# Patient Record
Sex: Female | Born: 1955 | Race: White | Hispanic: No | Marital: Married | Smoking: Former smoker
Health system: Southern US, Community
[De-identification: ages and names within clinical notes are randomized; demographics above are authoritative.]

## PROBLEM LIST (undated history)

## (undated) DIAGNOSIS — G8929 Other chronic pain: Secondary | ICD-10-CM

## (undated) DIAGNOSIS — F419 Anxiety disorder, unspecified: Secondary | ICD-10-CM

## (undated) DIAGNOSIS — M545 Low back pain, unspecified: Secondary | ICD-10-CM

## (undated) DIAGNOSIS — L719 Rosacea, unspecified: Secondary | ICD-10-CM

## (undated) DIAGNOSIS — E119 Type 2 diabetes mellitus without complications: Secondary | ICD-10-CM

## (undated) DIAGNOSIS — J302 Other seasonal allergic rhinitis: Secondary | ICD-10-CM

## (undated) DIAGNOSIS — E039 Hypothyroidism, unspecified: Secondary | ICD-10-CM

## (undated) DIAGNOSIS — J45909 Unspecified asthma, uncomplicated: Secondary | ICD-10-CM

## (undated) DIAGNOSIS — E785 Hyperlipidemia, unspecified: Secondary | ICD-10-CM

## (undated) HISTORY — PX: LAPAROTOMY: SHX154

## (undated) HISTORY — PX: OTHER SURGICAL HISTORY: SHX169

---

## 1998-11-03 ENCOUNTER — Other Ambulatory Visit: Admission: RE | Admit: 1998-11-03 | Discharge: 1998-11-03 | Payer: Self-pay | Admitting: Obstetrics and Gynecology

## 1998-12-26 ENCOUNTER — Ambulatory Visit (HOSPITAL_COMMUNITY): Admission: RE | Admit: 1998-12-26 | Discharge: 1998-12-26 | Payer: Self-pay | Admitting: Gastroenterology

## 1999-09-29 ENCOUNTER — Other Ambulatory Visit: Admission: RE | Admit: 1999-09-29 | Discharge: 1999-09-29 | Payer: Self-pay | Admitting: Obstetrics and Gynecology

## 2001-01-16 ENCOUNTER — Other Ambulatory Visit: Admission: RE | Admit: 2001-01-16 | Discharge: 2001-01-16 | Payer: Self-pay | Admitting: Obstetrics and Gynecology

## 2001-10-28 ENCOUNTER — Emergency Department (HOSPITAL_COMMUNITY): Admission: EM | Admit: 2001-10-28 | Discharge: 2001-10-28 | Payer: Self-pay

## 2002-01-29 ENCOUNTER — Other Ambulatory Visit: Admission: RE | Admit: 2002-01-29 | Discharge: 2002-01-29 | Payer: Self-pay | Admitting: Obstetrics and Gynecology

## 2003-05-28 ENCOUNTER — Other Ambulatory Visit: Admission: RE | Admit: 2003-05-28 | Discharge: 2003-05-28 | Payer: Self-pay | Admitting: Obstetrics and Gynecology

## 2004-05-25 ENCOUNTER — Encounter (INDEPENDENT_AMBULATORY_CARE_PROVIDER_SITE_OTHER): Payer: Self-pay | Admitting: Specialist

## 2004-05-25 ENCOUNTER — Ambulatory Visit (HOSPITAL_COMMUNITY): Admission: RE | Admit: 2004-05-25 | Discharge: 2004-05-25 | Payer: Self-pay | Admitting: Gastroenterology

## 2009-06-11 ENCOUNTER — Emergency Department (HOSPITAL_COMMUNITY): Admission: EM | Admit: 2009-06-11 | Discharge: 2009-06-11 | Payer: Self-pay | Admitting: Family Medicine

## 2009-06-16 ENCOUNTER — Emergency Department (HOSPITAL_COMMUNITY): Admission: EM | Admit: 2009-06-16 | Discharge: 2009-06-16 | Payer: Self-pay | Admitting: Emergency Medicine

## 2010-12-20 HISTORY — PX: COLONOSCOPY: SHX174

## 2011-03-29 LAB — POCT I-STAT, CHEM 8
BUN: 20 mg/dL (ref 6–23)
Calcium, Ion: 1.13 mmol/L (ref 1.12–1.32)
Chloride: 101 mEq/L (ref 96–112)
Creatinine, Ser: 0.8 mg/dL (ref 0.4–1.2)
Glucose, Bld: 89 mg/dL (ref 70–99)
HCT: 45 % (ref 36.0–46.0)
Hemoglobin: 15.3 g/dL — ABNORMAL HIGH (ref 12.0–15.0)
Potassium: 3.7 mEq/L (ref 3.5–5.1)
Sodium: 137 mEq/L (ref 135–145)
TCO2: 25 mmol/L (ref 0–100)

## 2011-05-07 NOTE — Op Note (Signed)
NAME:  Patricia Howell, Patricia Howell                     ACCOUNT NO.:  [id number]   MEDICAL RECORD NO.:  [id number]                   PATIENT TYPE:  AMB   LOCATION:  ENDO                                 FACILITY:  [facility]   PHYSICIAN:  John C. Madilyn Fireman, M.D.                 DATE OF BIRTH:  10/26/1956   DATE OF PROCEDURE:  05/25/2004  DATE OF DISCHARGE:                                 OPERATIVE REPORT   PROCEDURE:  Colonoscopy with polypectomy.   INDICATION FOR PROCEDURE:  Family history of colon cancer with last  colonoscopy 5 years ago.   DESCRIPTION OF PROCEDURE:  The patient was placed in the left lateral  decubitus position and placed on the pulse monitor with continuous low-flow  oxygen delivered by nasal cannula.  She was sedated with 100 mcg IV fentanyl  and 9 mg IV Versed.  The Olympus video colonoscope was inserted into the  rectum and advanced to the cecum, confirmed by transillumination at  McBurney's point and visualization of the ileocecal valve and appendiceal  orifice.  The prep was excellent.  The cecum, ascending, and transverse  colon appeared normal with no masses, polyps, diverticula, or other mucosal  abnormalities.  Within the descending colon, there was a 6 mm polyp that was  removed by hot biopsy.  The remainder of the descending, sigmoid, and rectum  appeared normal with no further polyps, masses, diverticula, or other  mucosal abnormalities.  The scope was then withdrawn and the patient  returned to the recovery room in stable condition.  She tolerated the  procedure well, and there were no immediate complications.   IMPRESSION:  1. Small descending colon polyp.  2. Otherwise, normal study.   PLAN:  We will await histology and probably repeat colonoscopy in 5 years.                                               John C. Madilyn Fireman, M.D.    JCH/MEDQ  D:  05/25/2004  T:  05/25/2004  Job:  [id number]   cc:   Sherry A. Rosalio Macadamia, M.D.  [address]  [place]  [place] [address]  Fax: [phone]   Oley Balm. Georgina Pillion, M.D.  [address]  [place]  [place] [address]  Fax: [phone]

## 2013-05-09 ENCOUNTER — Encounter (HOSPITAL_COMMUNITY): Payer: Self-pay

## 2013-05-09 ENCOUNTER — Encounter (HOSPITAL_COMMUNITY)
Admission: RE | Admit: 2013-05-09 | Discharge: 2013-05-09 | Disposition: A | Discharge status: Home / Self Care | Payer: BC Managed Care – PPO | Source: Ambulatory Visit | Attending: Obstetrics and Gynecology | Admitting: Obstetrics and Gynecology

## 2013-05-09 DIAGNOSIS — Z01818 Encounter for other preprocedural examination: Secondary | ICD-10-CM | POA: Insufficient documentation

## 2013-05-09 DIAGNOSIS — Z01812 Encounter for preprocedural laboratory examination: Secondary | ICD-10-CM | POA: Insufficient documentation

## 2013-05-09 HISTORY — DX: Other seasonal allergic rhinitis: J30.2

## 2013-05-09 HISTORY — DX: Low back pain, unspecified: M54.50

## 2013-05-09 HISTORY — DX: Low back pain: M54.5

## 2013-05-09 HISTORY — DX: Rosacea, unspecified: L71.9

## 2013-05-09 HISTORY — DX: Anxiety disorder, unspecified: F41.9

## 2013-05-09 HISTORY — DX: Hypothyroidism, unspecified: E03.9

## 2013-05-09 HISTORY — DX: Type 2 diabetes mellitus without complications: E11.9

## 2013-05-09 HISTORY — DX: Other chronic pain: G89.29

## 2013-05-09 HISTORY — DX: Hyperlipidemia, unspecified: E78.5

## 2013-05-09 HISTORY — DX: Unspecified asthma, uncomplicated: J45.909

## 2013-05-09 LAB — BASIC METABOLIC PANEL
CO2: 30 mEq/L (ref 19–32)
Chloride: 99 mEq/L (ref 96–112)
Creatinine, Ser: 0.91 mg/dL (ref 0.50–1.10)
Potassium: 5 mEq/L (ref 3.5–5.1)

## 2013-05-09 LAB — CBC
HCT: 41.5 % (ref 36.0–46.0)
Hemoglobin: 14.7 g/dL (ref 12.0–15.0)
MCV: 94.1 fL (ref 78.0–100.0)
RBC: 4.41 MIL/uL (ref 3.87–5.11)
RDW: 12.1 % (ref 11.5–15.5)
WBC: 4.8 10*3/uL (ref 4.0–10.5)

## 2013-05-09 NOTE — Patient Instructions (Addendum)
   Your procedure is scheduled on: Thursday, June 5  Enter through the [employer] of [facility] at: 630 am Pick up the phone at the desk and dial [phone] and inform us of your arrival.  Please call this number if you have any problems the morning of surgery: [phone]  Remember: Do not eat or drink after midnight: Wednesday Take these medicines the morning of surgery with a SIP OF WATER: Synthroid, claritan if needed with small sip of water on day of surgery.  Do not wear jewelry, make-up, or FINGER nail polish No metal in your hair or on your body. Do not wear lotions, powders, perfumes. You may wear deodorant.  Please use your CHG wash as directed prior to surgery.  Do not shave anywhere for at least 12 hours prior to first CHG shower.  Do not bring valuables to the hospital. Contacts, dentures or bridgework may not be worn into surgery.  Patients discharged on the day of surgery will not be allowed to drive home.  Home with friend Manon Hilding.

## 2013-05-10 MED ORDER — BUPIVACAINE HCL (PF) 0.25 % IJ SOLN
INTRAMUSCULAR | Status: AC
  Filled 2013-05-10: qty 30

## 2013-05-17 ENCOUNTER — Other Ambulatory Visit: Payer: Self-pay | Admitting: Obstetrics and Gynecology

## 2013-05-23 NOTE — H&P (Signed)
NAME:  Patricia Howell, ARNALL NO.:  [id number]  MEDICAL RECORD NO.:  [id number]  LOCATION:                                 FACILITY:  PHYSICIAN:  Lenoard Aden, M.D.DATE OF BIRTH:  1956-02-22  DATE OF ADMISSION: DATE OF DISCHARGE:                             HISTORY & PHYSICAL   Admitted to hospital outpatient surgery May 24, 2013  CHIEF COMPLAINT:  Postmenopausal bleeding, questionable structural lesions.  HISTORY OF PRESENT ILLNESS:  She is a 57 year old female G2, P2 with dysfunctional bleeding and questionable structural mass on sonohysterogram for definitive therapy.  ALLERGIES:  Her allergies are to amoxicillin and penicillin.  MEDICATIONS:  Include Claritin p.r.n., calcium, Effexor, Synthroid, Crestor, multivitamin, and fish oil.  SOCIAL HISTORY:  She is a nonsmoker, nondrinker.  Denies domestic physical violence.  PAST MEDICAL HISTORY:  She has a history of myomectomy in 1989, an excision of lymph node in 1989, history of 2 previous vaginal deliveries.  PHYSICAL EXAMINATION:  GENERAL:  She is a well-developed, well-nourished female, in no acute distress.  Height of 61 inches, weight of 201 pounds. HEENT:  Normal. NECK:  Supple.  Full range of motion. LUNGS:  Clear. HEART:  Regular rhythm. ABDOMEN:  Soft, nontender. PELVIC:  Uterus to be bulky, anteflexed.  No adnexal masses. EXTREMITIES:  No cords. NEUROLOGIC:  Nonfocal. SKIN:  Intact.  IMPRESSION:  Postmenopausal bleeding.  Questionable structural lesion.  PLAN:  Proceed with diagnostic hysteroscopy, Versapoint resection, D and C.  Risks of anesthesia, infection, bleeding, injury to surrounding organs, possible need for repair was discussed, delayed versus immediate complications to include bowel and bladder injury noted.  The patient acknowledges and wishes to proceed.     Lenoard Aden, M.D.     RJT/MEDQ  D:  05/23/2013  T:  05/23/2013  Job:  [id number]

## 2013-05-24 ENCOUNTER — Encounter (HOSPITAL_COMMUNITY)
Admission: RE | Disposition: A | Discharge status: Home / Self Care | Payer: Self-pay | Source: Ambulatory Visit | Attending: Obstetrics and Gynecology

## 2013-05-24 ENCOUNTER — Encounter (HOSPITAL_COMMUNITY): Payer: Self-pay | Admitting: Anesthesiology

## 2013-05-24 ENCOUNTER — Ambulatory Visit (HOSPITAL_COMMUNITY)
Admission: RE | Admit: 2013-05-24 | Discharge: 2013-05-24 | Disposition: A | Discharge status: Home / Self Care | Payer: BC Managed Care – PPO | Source: Ambulatory Visit | Attending: Obstetrics and Gynecology | Admitting: Obstetrics and Gynecology

## 2013-05-24 ENCOUNTER — Ambulatory Visit (HOSPITAL_COMMUNITY): Payer: BC Managed Care – PPO | Admitting: Anesthesiology

## 2013-05-24 DIAGNOSIS — N950 Postmenopausal bleeding: Secondary | ICD-10-CM | POA: Insufficient documentation

## 2013-05-24 DIAGNOSIS — N840 Polyp of corpus uteri: Secondary | ICD-10-CM | POA: Insufficient documentation

## 2013-05-24 HISTORY — PX: DILITATION & CURRETTAGE/HYSTROSCOPY WITH VERSAPOINT RESECTION: SHX5571

## 2013-05-24 SURGERY — DILATATION & CURETTAGE/HYSTEROSCOPY WITH VERSAPOINT RESECTION
Anesthesia: General | Site: Uterus | Wound class: Clean Contaminated

## 2013-05-24 MED ORDER — FENTANYL CITRATE 0.05 MG/ML IJ SOLN
INTRAMUSCULAR | Status: AC
  Filled 2013-05-24: qty 2

## 2013-05-24 MED ORDER — KETOROLAC TROMETHAMINE 30 MG/ML IJ SOLN
INTRAMUSCULAR | Status: AC
  Administered 2013-05-24: 30 mg
  Filled 2013-05-24: qty 1

## 2013-05-24 MED ORDER — KETOROLAC TROMETHAMINE 30 MG/ML IJ SOLN: 15.0000 mg | Freq: Once | INTRAMUSCULAR | Status: DC | PRN

## 2013-05-24 MED ORDER — CEFAZOLIN SODIUM-DEXTROSE 2-3 GM-% IV SOLR
2.0000 g | INTRAVENOUS | Status: AC
  Administered 2013-05-24: 2 g via INTRAVENOUS

## 2013-05-24 MED ORDER — LIDOCAINE HCL (CARDIAC) 20 MG/ML IV SOLN
INTRAVENOUS | Status: DC | PRN
  Administered 2013-05-24: 100 mg via INTRAVENOUS

## 2013-05-24 MED ORDER — GLYCOPYRROLATE 0.2 MG/ML IJ SOLN
INTRAMUSCULAR | Status: DC | PRN
  Administered 2013-05-24: 0.2 mg via INTRAVENOUS

## 2013-05-24 MED ORDER — LACTATED RINGERS IV SOLN
INTRAVENOUS | Status: DC
  Administered 2013-05-24 (×2): via INTRAVENOUS

## 2013-05-24 MED ORDER — FENTANYL CITRATE 0.05 MG/ML IJ SOLN
25.0000 ug | INTRAMUSCULAR | Status: DC | PRN
  Administered 2013-05-24 (×3): 50 ug via INTRAVENOUS

## 2013-05-24 MED ORDER — MIDAZOLAM HCL 5 MG/5ML IJ SOLN
INTRAMUSCULAR | Status: DC | PRN
  Administered 2013-05-24: 2 mg via INTRAVENOUS

## 2013-05-24 MED ORDER — VASOPRESSIN 20 UNIT/ML IJ SOLN
INTRAVENOUS | Status: DC | PRN
  Administered 2013-05-24: 10:00:00 via INTRAMUSCULAR

## 2013-05-24 MED ORDER — PROPOFOL 10 MG/ML IV BOLUS
INTRAVENOUS | Status: DC | PRN
  Administered 2013-05-24: 200 mg via INTRAVENOUS

## 2013-05-24 MED ORDER — FENTANYL CITRATE 0.05 MG/ML IJ SOLN
INTRAMUSCULAR | Status: DC | PRN
  Administered 2013-05-24 (×2): 50 ug via INTRAVENOUS

## 2013-05-24 MED ORDER — OXYCODONE-ACETAMINOPHEN 5-325 MG PO TABS: 1.0000 | ORAL_TABLET | ORAL | Status: AC | PRN

## 2013-05-24 MED ORDER — VASOPRESSIN 20 UNIT/ML IJ SOLN
INTRAMUSCULAR | Status: AC
  Filled 2013-05-24: qty 1

## 2013-05-24 MED ORDER — PHENYLEPHRINE HCL 10 MG/ML IJ SOLN
INTRAMUSCULAR | Status: DC | PRN
  Administered 2013-05-24: 80 ug via INTRAVENOUS

## 2013-05-24 MED ORDER — MIDAZOLAM HCL 2 MG/2ML IJ SOLN
INTRAMUSCULAR | Status: AC
  Filled 2013-05-24: qty 2

## 2013-05-24 MED ORDER — CEFAZOLIN SODIUM-DEXTROSE 2-3 GM-% IV SOLR
INTRAVENOUS | Status: AC
  Filled 2013-05-24: qty 50

## 2013-05-24 MED ORDER — SODIUM CHLORIDE 0.9 % IR SOLN
Status: DC | PRN
  Administered 2013-05-24: 3000 mL

## 2013-05-24 SURGICAL SUPPLY — 17 items
CANISTER SUCTION 2500CC (MISCELLANEOUS) ×6 IMPLANT
CATH ROBINSON RED A/P 16FR (CATHETERS) ×2 IMPLANT
CLOTH BEACON ORANGE TIMEOUT ST (SAFETY) ×2 IMPLANT
CONTAINER PREFILL 10% NBF 60ML (FORM) ×4 IMPLANT
DRESSING TELFA 8X3 (GAUZE/BANDAGES/DRESSINGS) ×2 IMPLANT
ELECTRODE RT ANGLE VERSAPOINT (CUTTING LOOP) ×2 IMPLANT
GLOVE BIO SURGEON STRL SZ7.5 (GLOVE) ×4 IMPLANT
GLOVE BIOGEL PI IND STRL 7.0 (GLOVE) ×2 IMPLANT
GLOVE BIOGEL PI INDICATOR 7.0 (GLOVE) ×2
GLOVE SURG SS PI 6.5 STRL IVOR (GLOVE) ×8 IMPLANT
GLOVE SURG SS PI 7.0 STRL IVOR (GLOVE) ×4 IMPLANT
GOWN STRL REIN XL XLG (GOWN DISPOSABLE) ×6 IMPLANT
PACK HYSTEROSCOPY LF (CUSTOM PROCEDURE TRAY) ×2 IMPLANT
PAD OB MATERNITY 4.3X12.25 (PERSONAL CARE ITEMS) ×2 IMPLANT
SYR TB 1ML 25GX5/8 (SYRINGE) ×2 IMPLANT
TOWEL OR 17X24 6PK STRL BLUE (TOWEL DISPOSABLE) ×6 IMPLANT
WATER STERILE IRR 1000ML POUR (IV SOLUTION) ×2 IMPLANT

## 2013-05-24 NOTE — Progress Notes (Signed)
Patient ID: Patricia Howell, female   DOB: 10-08-1956, 57 y.o.   MRN: [id number] Patient seen and examined. Consent witnessed and signed. No changes noted. Update completed.

## 2013-05-24 NOTE — Transfer of Care (Signed)
Immediate Anesthesia Transfer of Care Note  Patient: Patricia Howell  Procedure(s) Performed: Procedure(s): DILATATION & CURETTAGE/HYSTEROSCOPY WITH VERSAPOINT RESECTION (N/A)  Patient Location: PACU  Anesthesia Type:General  Level of Consciousness: awake, alert  and oriented  Airway & Oxygen Therapy: Patient Spontanous Breathing and Patient connected to nasal cannula oxygen  Post-op Assessment: Report given to PACU RN and Post -op Vital signs reviewed and stable  Post vital signs: Reviewed and stable  Complications: No apparent anesthesia complications 

## 2013-05-24 NOTE — Op Note (Signed)
05/24/2013  9:38 AM  PATIENT:  Patricia Howell  57 y.o. female  PRE-OPERATIVE DIAGNOSIS:  Postmenopausal Bleeding   Endometrial lesion- ? Polypoid mass  POST-OPERATIVE DIAGNOSIS:  Postmenopausal Bleeding   Endometrial mass  PROCEDURE:  Procedure(s): DILATATION & CURETTAGE/HYSTEROSCOPY WITH VERSAPOINT RESECTION  SURGEON:  Surgeon(s): Lenoard Aden, MD  ASSISTANTS: none   ANESTHESIA:   local and general  ESTIMATED BLOOD LOSS: minimal  DRAINS: none   LOCAL MEDICATIONS USED:  NONE  SPECIMEN:  Source of Specimen:  Endometrial curettings and endometrial polypoid mass  DISPOSITION OF SPECIMEN:  PATHOLOGY  COUNTS:  YES  DICTATION #: [id number]  PLAN OF CARE: DC home  PATIENT DISPOSITION:  PACU - hemodynamically stable.

## 2013-05-24 NOTE — Transfer of Care (Signed)
Immediate Anesthesia Transfer of Care Note  Patient: Patricia Howell  Procedure(s) Performed: Procedure(s): DILATATION & CURETTAGE/HYSTEROSCOPY WITH VERSAPOINT RESECTION (N/A)  Patient Location: PACU  Anesthesia Type:General  Level of Consciousness: awake, alert  and oriented  Airway & Oxygen Therapy: Patient Spontanous Breathing and Patient connected to nasal cannula oxygen  Post-op Assessment: Report given to PACU RN and Post -op Vital signs reviewed and stable  Post vital signs: Reviewed and stable  Complications: No apparent anesthesia complications

## 2013-05-24 NOTE — Anesthesia Procedure Notes (Signed)
Procedure Name: LMA Insertion, Intubation and Awake intubation Date/Time: 05/24/2013 9:19 AM Performed by: Alayziah Tangeman, Jannet Askew Pre-anesthesia Checklist: Patient identified, Timeout performed, Emergency Drugs available, Suction available and Patient being monitored Patient Re-evaluated:Patient Re-evaluated prior to inductionOxygen Delivery Method: Circle system utilized Preoxygenation: Pre-oxygenation with 100% oxygen Intubation Type: IV induction LMA Size: 4.0 Number of attempts: 1 Placement Confirmation: positive ETCO2 and breath sounds checked- equal and bilateral ETT to lip (cm): at lips. Dental Injury: Teeth and Oropharynx as per pre-operative assessment

## 2013-05-24 NOTE — Anesthesia Preprocedure Evaluation (Signed)
Anesthesia Evaluation  Patient identified by MRN, date of birth, ID band Patient awake    Reviewed: Allergy & Precautions, H&P , NPO status , Patient's Chart, lab work & pertinent test results, reviewed documented beta blocker date and time   History of Anesthesia Complications Negative for: history of anesthetic complications  Airway Mallampati: III TM Distance: >3 FB Neck ROM: full    Dental  (+) Teeth Intact,    Pulmonary former smoker (quit 2008),  Seasonal allergies breath sounds clear to auscultation  Pulmonary exam normal       Cardiovascular Exercise Tolerance: Good Rhythm:regular Rate:Normal  High cholesterol   Neuro/Psych PSYCHIATRIC DISORDERS (anxiety - on meds) negative neurological ROS     GI/Hepatic negative GI ROS, Neg liver ROS,   Endo/Other  diabetes (diet-controlled), Type 2Hypothyroidism Morbid obesity (BMI 38)  Renal/GU   negative genitourinary   Musculoskeletal   Abdominal   Peds  Hematology negative hematology ROS (+)   Anesthesia Other Findings   Reproductive/Obstetrics negative OB ROS                           Anesthesia Physical Anesthesia Plan  ASA: III  Anesthesia Plan: General LMA   Post-op Pain Management:    Induction:   Airway Management Planned:   Additional Equipment:   Intra-op Plan:   Post-operative Plan:   Informed Consent: I have reviewed the patients History and Physical, chart, labs and discussed the procedure including the risks, benefits and alternatives for the proposed anesthesia with the patient or authorized representative who has indicated his/her understanding and acceptance.   Dental Advisory Given  Plan Discussed with: CRNA and Surgeon  Anesthesia Plan Comments:         Anesthesia Quick Evaluation

## 2013-05-24 NOTE — Op Note (Signed)
NAME:  Patricia Howell, MARANDO NO.:  [id number]  MEDICAL RECORD NO.:  [id number]  LOCATION:  [facility]                          FACILITY:  [facility]  PHYSICIAN:  Lenoard Aden, M.D.DATE OF BIRTH:  July 16, 1956  DATE OF PROCEDURE:  05/24/2013 DATE OF DISCHARGE:  05/24/2013                              OPERATIVE REPORT   PREOPERATIVE DIAGNOSIS:  Postmenopausal bleeding with endometrial polypoid lesion on saline sonohysterography.  POSTOPERATIVE DIAGNOSIS:  Postmenopausal bleeding with endometrial polypoid lesion on saline sonohysterography.  PROCEDURE:  Diagnostic hysteroscopy with VersaPoint resection of endometrial mass.  D and C.  SURGEON:  Lenoard Aden, M.D.  ASSISTANT:  None.  ANESTHESIA:  General.  ESTIMATED BLOOD LOSS:  Minimal.  FLUID DEFICIT:  90 mL.  DESCRIPTION OF PROCEDURE:  After being apprised of risks of anesthesia, infection, bleeding, injury to surrounding organs, possible need for repair, inability to cure, possibility of endometrial cancer, the patient was brought to the operating room where she was administered general anesthetic.  Prepped and draped in the usual sterile fashion. Feet were placed in Yellofin stirrups.  Exam under anesthesia revealed a small anteflexed uterus and no adnexal masses.  Cervix was infiltrated at the cervical vaginal junction at 3 and 9 o'clock with a dilute Pitressin solution.  The cervix was easily dilated to a 31 Pratt dilator.  Hysteroscope was placed.  Visualization revealed a fundal polypoid mass which extended down to the level of the internal os.  Upon entering the hysteroscope, this mass was encountered in the midline.  It was resected in its entirety using the right angle loop up to the level of the fundus.  Bilateral tubal ostia were noted and complete resection of the lesion was noted.  D and C was performed in a 4-quadrant method without difficulty.  Revisualization reveals a completely  removed endometrial lesion.  No evidence of any other lesions in the endometrial cavity, and no evidence of uterine perforation.  Fluid deficit was 90 mL.  Procedure was terminated.  The patient tolerated the procedure well, was awakened, and transferred to recovery in good condition.     Lenoard Aden, M.D.     RJT/MEDQ  D:  05/24/2013  T:  05/24/2013  Job:  [id number]

## 2013-05-24 NOTE — Anesthesia Postprocedure Evaluation (Signed)
  Anesthesia Post-op Note  Anesthesia Post Note  Patient: Patricia Howell  Procedure(s) Performed: Procedure(s) (LRB): DILATATION & CURETTAGE/HYSTEROSCOPY WITH VERSAPOINT RESECTION (N/A)  Anesthesia type: General  Patient location: PACU  Post pain: Pain level controlled  Post assessment: Post-op Vital signs reviewed  Last Vitals:  Filed Vitals:   05/24/13 1045  BP: 122/58  Pulse: 92  Temp:   Resp: 15    Post vital signs: Reviewed  Level of consciousness: sedated  Complications: No apparent anesthesia complications

## 2013-05-25 ENCOUNTER — Encounter (HOSPITAL_COMMUNITY): Payer: Self-pay | Admitting: Obstetrics and Gynecology

## 2014-08-22 ENCOUNTER — Encounter: Payer: BC Managed Care – PPO | Attending: Family Medicine

## 2014-08-22 VITALS — Ht 63.0 in | Wt 206.3 lb

## 2014-08-22 DIAGNOSIS — E119 Type 2 diabetes mellitus without complications: Secondary | ICD-10-CM

## 2014-08-22 DIAGNOSIS — Z713 Dietary counseling and surveillance: Secondary | ICD-10-CM | POA: Insufficient documentation

## 2014-08-22 NOTE — Progress Notes (Signed)
Patient was seen on 08-22-14 for the first of a series of three diabetes self-management courses at the [facility].  Patient Education Plan per assessed needs and concerns is to attend four course education program for Diabetes Self Management Education.  Current HbA1c: 6.5%  The following learning objectives were met by the patient during this class:  Describe diabetes  State some common risk factors for diabetes  Defines the role of glucose and insulin  Identifies type of diabetes and pathophysiology  Describe the relationship between diabetes and cardiovascular risk  State the members of the Healthcare Team  States the rationale for glucose monitoring  State when to test glucose  State their individual Target Range  State the importance of logging glucose readings  Describe how to interpret glucose readings  Identifies A1C target  Explain the correlation between A1c and eAG values  State symptoms and treatment of high blood glucose  State symptoms and treatment of low blood glucose  Explain proper technique for glucose testing  Identifies proper sharps disposal  Handouts given during class include:  Living Well with Diabetes book  Carb Counting and Meal Planning book  Meal Plan Card  Carbohydrate guide  Meal planning worksheet  Low Sodium Flavoring Tips  The diabetes portion plate  H3Z to eAG Conversion Chart  Diabetes Medications  Diabetes Recommended Care Schedule  Support Group  Diabetes Success Plan  Core Class Satisfaction Survey  Follow-Up Plan:  Attend core 2

## 2014-08-29 DIAGNOSIS — E119 Type 2 diabetes mellitus without complications: Secondary | ICD-10-CM

## 2014-08-29 NOTE — Progress Notes (Signed)

## 2014-09-05 DIAGNOSIS — E119 Type 2 diabetes mellitus without complications: Secondary | ICD-10-CM | POA: Diagnosis not present

## 2014-09-09 NOTE — Progress Notes (Signed)
Patient was seen on 09/05/14 for the third of a series of three diabetes self-management courses at the [facility]. The following learning objectives were met by the patient during this class:    State the amount of activity recommended for healthy living   Describe activities suitable for individual needs   Identify ways to regularly incorporate activity into daily life   Identify barriers to activity and ways to over come these barriers  Identify diabetes medications being personally used and their primary action for lowering glucose and possible side effects   Describe role of stress on blood glucose and develop strategies to address psychosocial issues   Identify diabetes complications and ways to prevent them  Explain how to manage diabetes during illness   Evaluate success in meeting personal goal   Establish 2-3 goals that they will plan to diligently work on until they return for the  35-monthfollow-up visit  Goals:  I will count my carb choices at most meals and snacks I will be active 20 minutes or more 3 times a week I will take my diabetes medications as scheduled I will eat less unhealthy fats  I will test my glucose at least 1 times a day, 7 days a week I will look at patterns in my record book at least 2 days a month To help manage stress I will  So relaxation exercises 7 times a week   Bariers to success: Motivation, stress Support [facility]: [facility], family support, On-line resources  Plan:  Attend Core 4 in 4 months  Bring food record and glucose log to all healthcare visits

## 2014-12-23 ENCOUNTER — Encounter: Payer: 59 | Attending: Family Medicine

## 2014-12-23 DIAGNOSIS — Z713 Dietary counseling and surveillance: Secondary | ICD-10-CM | POA: Insufficient documentation

## 2014-12-23 DIAGNOSIS — E119 Type 2 diabetes mellitus without complications: Secondary | ICD-10-CM | POA: Diagnosis not present

## 2014-12-24 NOTE — Progress Notes (Signed)
Appt start time: 0900 end time:  1000.  Patient was seen on 12/23/13 for a review of the series of three diabetes self-management courses at the [facility]. The following learning objectives were met by the patient during this class:  . Reviewed blood glucose monitoring and interpretation including the recommended target ranges and Hgb A1c.  . Reviewed on carb counting, importance of regularly scheduled meals/snacks, and meal planning.  . Reviewed the effects of physical activity on glucose levels and long-term glucose control.  Recommended goal of 150 minutes of physical activity/week. . Reviewed patient medications and discussed role of medication on blood glucose and possible side effects. . Discussed strategies to manage stress, psychosocial issues, and other obstacles to diabetes management. . Encouraged moderate weight reduction to improve glucose levels.   . Reviewed short-term complications: hyper- and hypo-glycemia.  Discussed causes, symptoms, and treatment options. . Reviewed prevention, detection, and treatment of long-term complications.  Discussed the role of prolonged elevated glucose levels on body systems.  Goals:  Follow Diabetes Meal Plan as instructed  Eat 3 meals and 2 snacks, every 3-5 hrs  Limit carbohydrate intake to 45 grams carbohydrate/meal Limit carbohydrate intake to 15 grams carbohydrate/snack Add lean protein foods to meals/snacks  Monitor glucose levels as instructed by your doctor  Aim for goal of 15-30 mins of physical activity daily as tolerated  Bring food record and glucose log to your next nutrition visit

## 2016-09-13 ENCOUNTER — Other Ambulatory Visit: Payer: Self-pay | Admitting: Family Medicine

## 2016-09-13 DIAGNOSIS — R945 Abnormal results of liver function studies: Principal | ICD-10-CM

## 2016-09-13 DIAGNOSIS — R7989 Other specified abnormal findings of blood chemistry: Secondary | ICD-10-CM

## 2016-09-21 ENCOUNTER — Other Ambulatory Visit: Payer: Self-pay

## 2016-10-05 ENCOUNTER — Ambulatory Visit
Admission: RE | Admit: 2016-10-05 | Discharge: 2016-10-05 | Disposition: A | Discharge status: Home / Self Care | Payer: BLUE CROSS/BLUE SHIELD | Source: Ambulatory Visit | Attending: Family Medicine | Admitting: Family Medicine

## 2016-10-05 DIAGNOSIS — R7989 Other specified abnormal findings of blood chemistry: Secondary | ICD-10-CM

## 2016-10-05 DIAGNOSIS — R945 Abnormal results of liver function studies: Principal | ICD-10-CM

## 2018-05-03 IMAGING — US US ABDOMEN COMPLETE
1 series · 14 of 25 positions shown · non-contrast
Comparison: No recent prior .

CLINICAL DATA: Elevated LFTs.

EXAM:
ABDOMEN ULTRASOUND COMPLETE

[Series 1: us abdomen complete · 0.28mm/px · 14 of 81 slices shown]
[im 1/81]
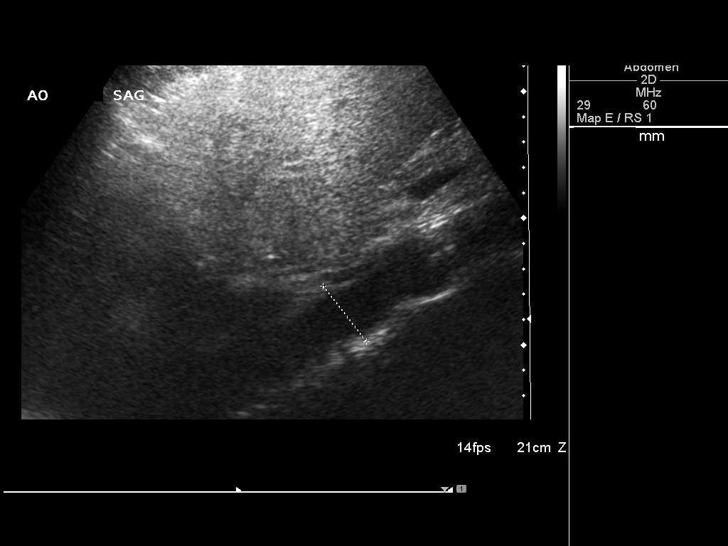
[im 7/81]
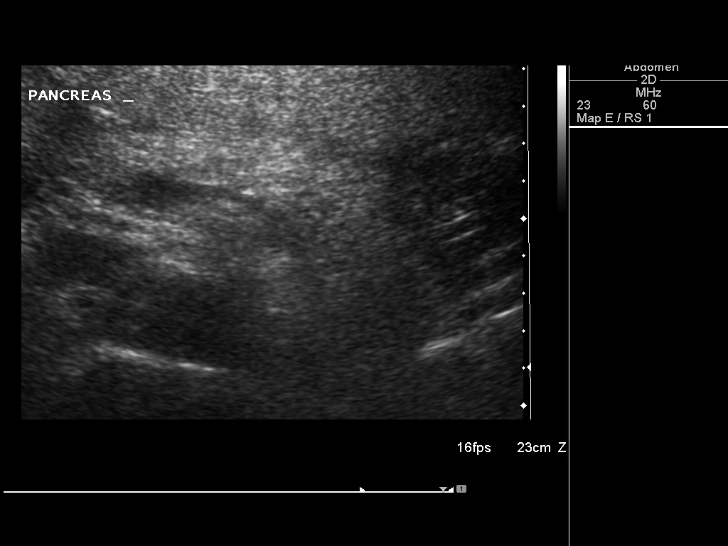
[im 14/81]
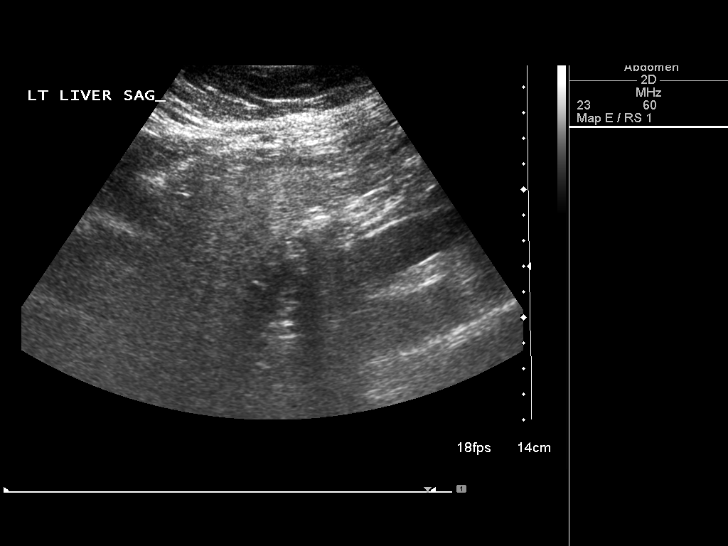
[im 21/81]
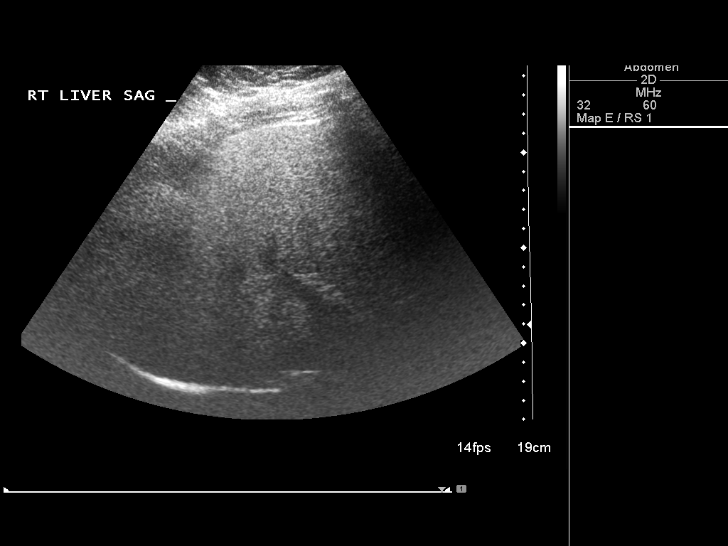
[im 27/81]
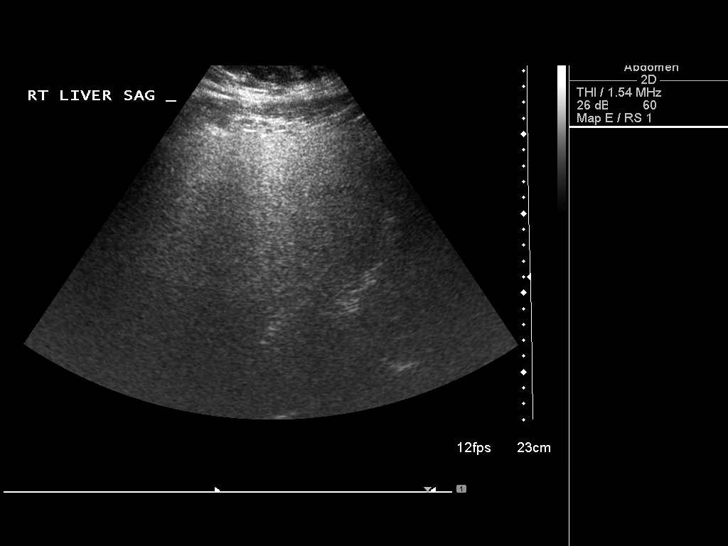
[im 31/81]
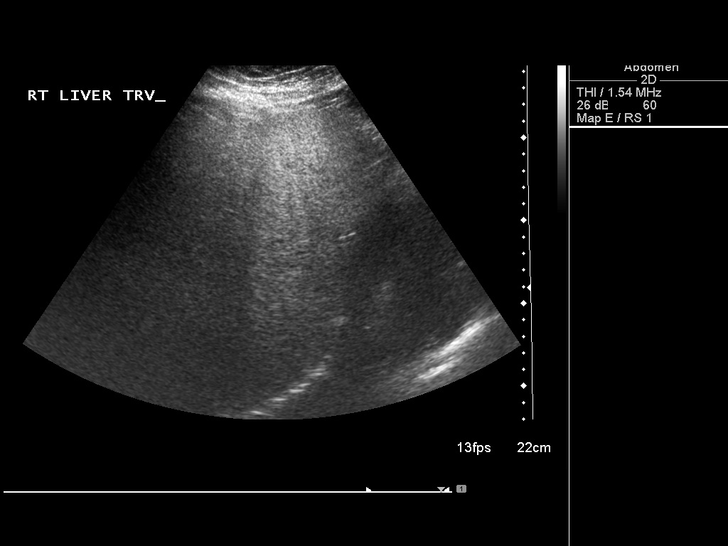
[im 37/81]
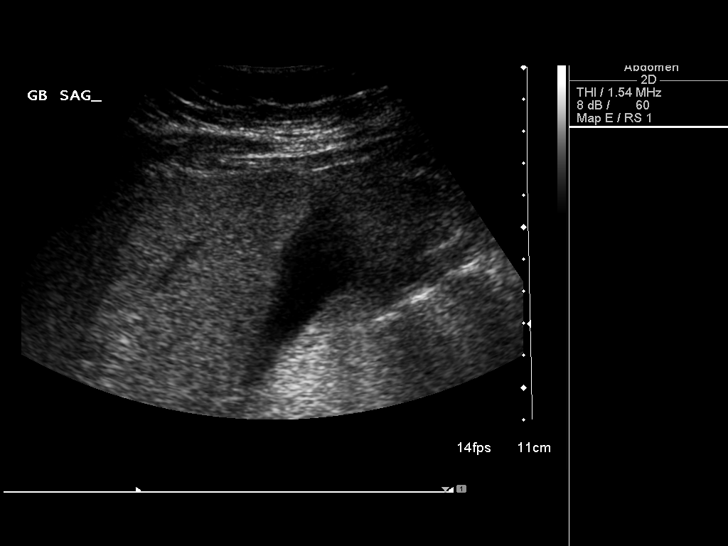
[im 44/81]
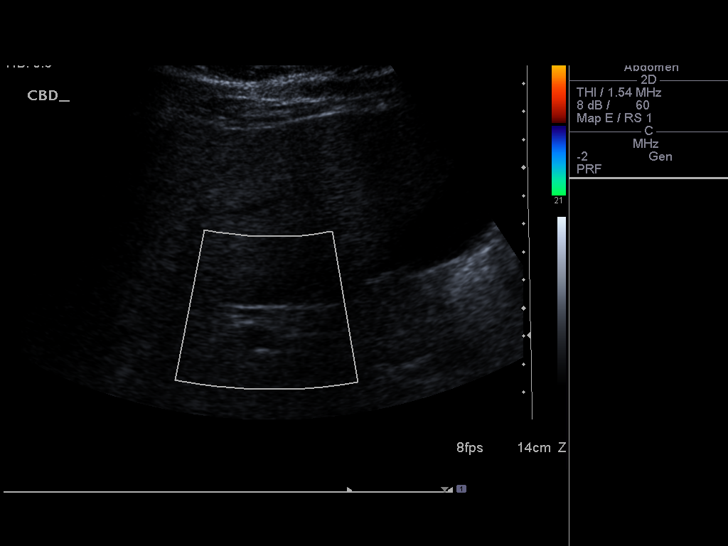
[im 51/81]
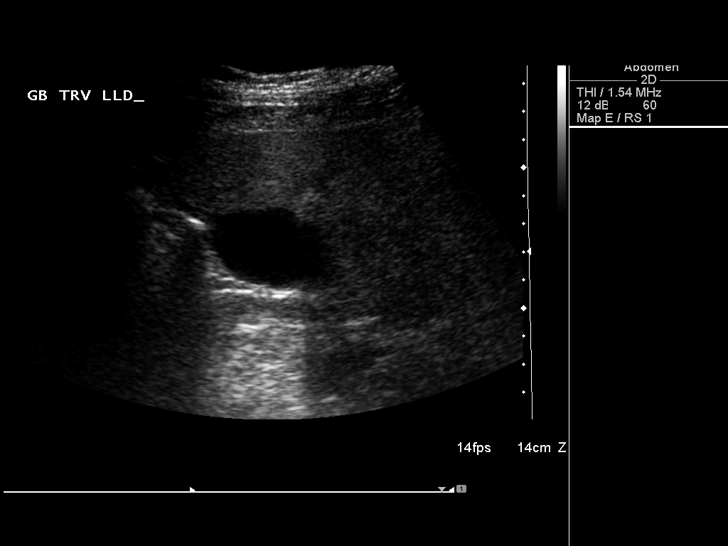
[im 54/81]
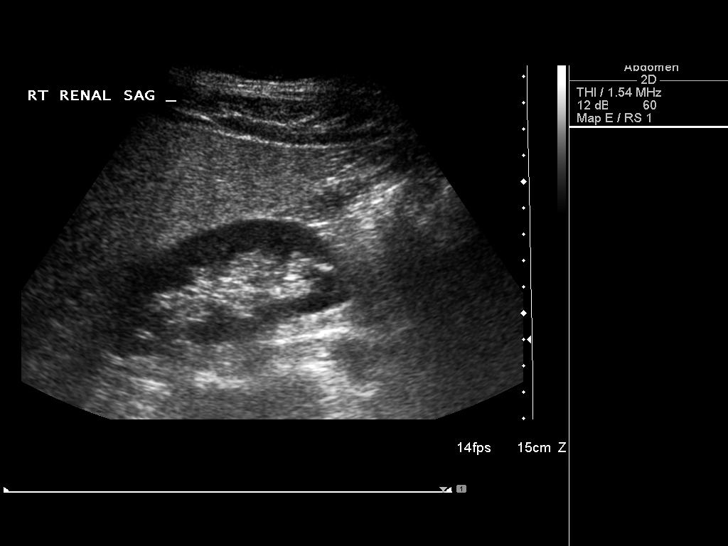
[im 61/81]
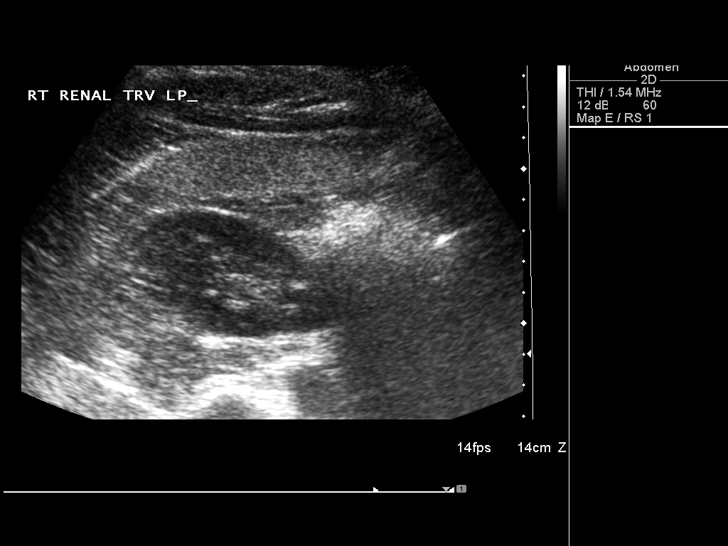
[im 67/81]
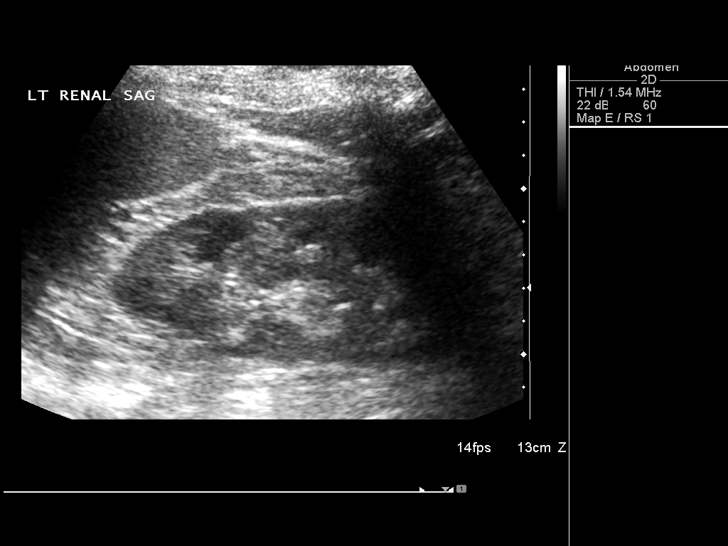
[im 74/81]
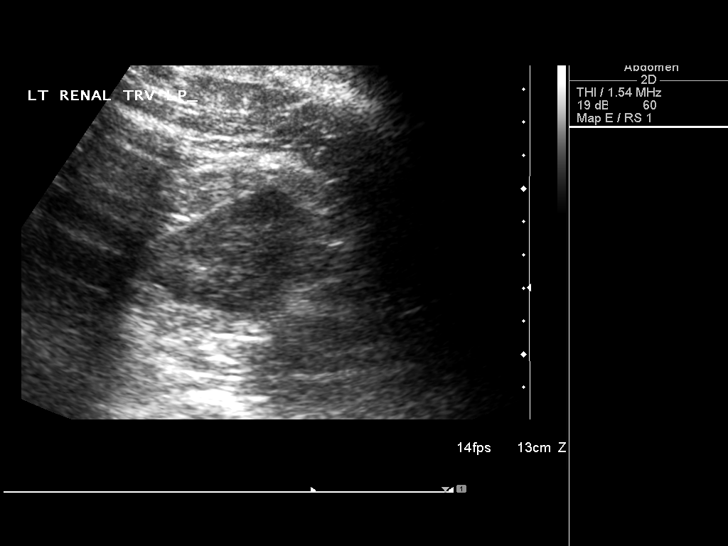
[im 81/81]
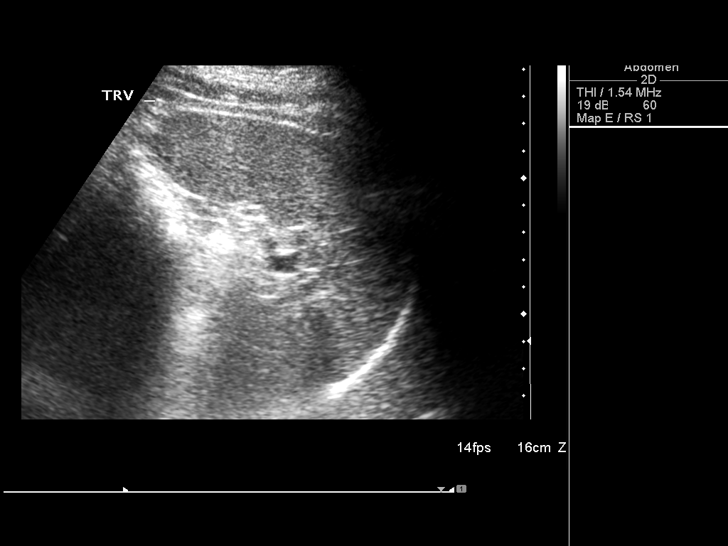

[14 of 25 positions shown; findings below may reference images not displayed]

FINDINGS: Gallbladder: No gallstones or wall thickening visualized. No
sonographic Murphy sign noted by sonographer.

Common bile duct: Diameter: 3.3 mm

Liver: There is echogenic consistent fatty infiltration and/or
hepatocellular disease.

IVC: No abnormality visualized.

Pancreas: Visualized portion unremarkable.

Spleen: Size and appearance within normal limits.

Right Kidney: Length: 10.7 cm. Echogenicity within normal limits. No
mass or hydronephrosis visualized.

Left Kidney: Length: 11.4 cm. Echogenicity within normal limits. No
mass or hydronephrosis visualized.

Abdominal aorta: No aneurysm visualized.

Other findings: None.
IMPRESSION: No acute or focal abnormality identified. The liver is echogenic
consistent with fatty infiltration and/or hepatocellular disease.

## 2018-06-25 ENCOUNTER — Encounter (HOSPITAL_COMMUNITY): Payer: Self-pay | Admitting: Emergency Medicine

## 2018-06-25 ENCOUNTER — Other Ambulatory Visit: Payer: Self-pay

## 2018-06-25 ENCOUNTER — Emergency Department (HOSPITAL_COMMUNITY)
Admission: EM | Admit: 2018-06-25 | Discharge: 2018-06-25 | Disposition: A | Discharge status: Home / Self Care | Payer: BLUE CROSS/BLUE SHIELD | Attending: Emergency Medicine | Admitting: Emergency Medicine

## 2018-06-25 DIAGNOSIS — L299 Pruritus, unspecified: Secondary | ICD-10-CM | POA: Diagnosis not present

## 2018-06-25 DIAGNOSIS — E119 Type 2 diabetes mellitus without complications: Secondary | ICD-10-CM | POA: Diagnosis not present

## 2018-06-25 DIAGNOSIS — R110 Nausea: Secondary | ICD-10-CM | POA: Diagnosis not present

## 2018-06-25 DIAGNOSIS — Z87891 Personal history of nicotine dependence: Secondary | ICD-10-CM | POA: Insufficient documentation

## 2018-06-25 DIAGNOSIS — T7840XA Allergy, unspecified, initial encounter: Secondary | ICD-10-CM | POA: Diagnosis not present

## 2018-06-25 DIAGNOSIS — R232 Flushing: Secondary | ICD-10-CM | POA: Diagnosis present

## 2018-06-25 DIAGNOSIS — Z79899 Other long term (current) drug therapy: Secondary | ICD-10-CM | POA: Diagnosis not present

## 2018-06-25 DIAGNOSIS — J45909 Unspecified asthma, uncomplicated: Secondary | ICD-10-CM | POA: Diagnosis not present

## 2018-06-25 DIAGNOSIS — E039 Hypothyroidism, unspecified: Secondary | ICD-10-CM | POA: Insufficient documentation

## 2018-06-25 DIAGNOSIS — Z7984 Long term (current) use of oral hypoglycemic drugs: Secondary | ICD-10-CM | POA: Insufficient documentation

## 2018-06-25 MED ORDER — SODIUM CHLORIDE 0.9 % IV BOLUS
1000.0000 mL | Freq: Once | INTRAVENOUS | Status: AC
  Administered 2018-06-25: 1000 mL via INTRAVENOUS

## 2018-06-25 MED ORDER — DIPHENHYDRAMINE HCL 50 MG/ML IJ SOLN
25.0000 mg | Freq: Once | INTRAMUSCULAR | Status: AC
  Administered 2018-06-25: 25 mg via INTRAVENOUS
  Filled 2018-06-25: qty 1

## 2018-06-25 MED ORDER — METHYLPREDNISOLONE SODIUM SUCC 125 MG IJ SOLR
125.0000 mg | Freq: Once | INTRAMUSCULAR | Status: AC
  Administered 2018-06-25: 125 mg via INTRAVENOUS
  Filled 2018-06-25: qty 2

## 2018-06-25 MED ORDER — DIPHENHYDRAMINE HCL 25 MG PO TABS: 25.0000 mg | ORAL_TABLET | Freq: Three times a day (TID) | ORAL | 0 refills | Status: AC

## 2018-06-25 MED ORDER — FAMOTIDINE IN NACL 20-0.9 MG/50ML-% IV SOLN
20.0000 mg | Freq: Once | INTRAVENOUS | Status: AC
  Administered 2018-06-25: 20 mg via INTRAVENOUS
  Filled 2018-06-25: qty 50

## 2018-06-25 MED ORDER — FAMOTIDINE 20 MG PO TABS: 20.0000 mg | ORAL_TABLET | Freq: Two times a day (BID) | ORAL | 0 refills | Status: AC

## 2018-06-25 MED ORDER — PREDNISONE 20 MG PO TABS: 40.0000 mg | ORAL_TABLET | Freq: Every day | ORAL | 0 refills | Status: AC

## 2018-06-25 NOTE — ED Notes (Signed)
ED Provider at bedside. 

## 2018-06-25 NOTE — ED Provider Notes (Signed)
[facility] Provider Note   CSN: [id number] Arrival date & time: 06/25/18  1843     History   Chief Complaint Chief Complaint  Patient presents with  . Allergic Reaction    HPI Patricia Howell is a 62 y.o. female.  HPI Facial flushing, generalized discomfort, nausea, itchiness. Onset was about 2 hours ago, about 2 hours after she took the first dose of Bactrim. Patient states that she is generally well although she acknowledges a history of asthma, allergies, receiving biweekly doses. However, she is recently developed discomfort in her left ear, and today went to urgent care. There, she was diagnosed with possible infection, started on Bactrim, and after obtaining an taking her first dosage, symptoms began, as above. Subsequently she took 1 Zyrtec, feels marginally better. No pain throughout, no confusion throughout, no fever, no vomiting throughout. She is here with a female companion who assists with the HPI.  Past Medical History:  Diagnosis Date  . Anxiety   . Asthma    no problems as adult - no inhaler  . Chronic lower back pain    otc meds prn  . Diabetes mellitus without complication (HCC)    border line - diet controlled  . Hyperlipidemia   . Hypothyroidism   . Rosacea   . Seasonal allergies   . SVD (spontaneous vaginal delivery)    x 2    There are no active problems to display for this patient.   Past Surgical History:  Procedure Laterality Date  . COLONOSCOPY  2012  . DILITATION & CURRETTAGE/HYSTROSCOPY WITH VERSAPOINT RESECTION N/A 05/24/2013   Procedure: DILATATION & CURETTAGE/HYSTEROSCOPY WITH VERSAPOINT RESECTION;  Surgeon: Lenoard Adenichard J Taavon, MD;  Location: [facility];  Service: Gynecology;  Laterality: N/A;  . LAPAROTOMY     removal of cyst  . urethea stretched     as a child     OB History   None      Home Medications    Prior to Admission medications   Medication Sig Start Date End Date Taking?  Authorizing Provider  Calcium Citrate-Vitamin D (CALCIUM CITRATE + D3 PO) Take 1 tablet by mouth every morning.   Yes [provider]  cetirizine (ZYRTEC) 10 MG tablet Take 10 mg by mouth daily.   Yes [provider]  Collagen Hydrolysate, Bovine, POWD Take 1 Scoop by mouth daily.   Yes [provider]  ibuprofen (ADVIL,MOTRIN) 200 MG tablet Take 400 mg by mouth every 6 (six) hours as needed for pain.   Yes [provider]  levothyroxine (SYNTHROID, LEVOTHROID) 88 MCG tablet Take 88 mcg by mouth daily. 06/17/18  Yes [provider]  loratadine (CLARITIN) 10 MG tablet Take 10 mg by mouth at bedtime as needed for allergies.   Yes [provider]  losartan (COZAAR) 100 MG tablet Take 100 mg by mouth daily. 04/20/18  Yes [provider]  metFORMIN (GLUMETZA) 1000 MG (MOD) 24 hr tablet Take 1,000 mg by mouth daily before supper.   Yes [provider]  rosuvastatin (CRESTOR) 20 MG tablet Take 20 mg by mouth daily.    Yes [provider]  sulfamethoxazole-trimethoprim (BACTRIM DS,SEPTRA DS) 800-160 MG tablet Take 1 tablet by mouth 2 (two) times daily. 06/25/18 07/05/18 Yes [provider]  venlafaxine (EFFEXOR) 75 MG tablet Take 187.5 mg by mouth at bedtime.    Yes [provider]  oxyCODONE-acetaminophen (ROXICET) 5-325 MG per tablet Take 1-2 tablets by mouth every 4 (  four) hours as needed for pain. Patient not taking: Reported on 06/25/2018 05/24/13   Olivia Mackie, MD    Family History Family History  Problem Relation Age of Onset  . Arthritis Other   . Cancer Other   . Hyperlipidemia Other   . Hypertension Other   . Stroke Other   . Diabetes Other     Social History Social History   Tobacco Use  . Smoking status: Former Smoker    Packs/day: 1.00    Years: 22.00    Pack years: 22.00    Types: Cigarettes    Last attempt to quit: 12/20/2006    Years since quitting: 11.5  . Smokeless tobacco: Never  Used  Substance Use Topics  . Alcohol use: Yes    Comment: weekends - wine/beer  . Drug use: No     Allergies   Vicodin [hydrocodone-acetaminophen] and Amoxicillin   Review of Systems Review of Systems  Constitutional:       Per HPI, otherwise negative  HENT:       Per HPI, otherwise negative  Respiratory:       Per HPI, otherwise negative  Cardiovascular:       Per HPI, otherwise negative  Gastrointestinal: Positive for nausea. Negative for vomiting.  Endocrine:       Negative aside from HPI  Genitourinary:       Neg aside from HPI   Musculoskeletal:       Per HPI, otherwise negative  Skin: Positive for color change.  Neurological: Negative for syncope.     Physical Exam Updated Vital Signs BP 125/75   Pulse (!) 103   Temp 98.7 F (37.1 C) (Oral)   Resp 18   SpO2 95%   Physical Exam  Constitutional: She is oriented to person, place, and time. She appears well-developed and well-nourished. No distress.  Female sitting upright with facial flushing speaking clearly  HENT:  Head: Normocephalic and atraumatic.  Eyes: Conjunctivae and EOM are normal.  Cardiovascular: Regular rhythm. Tachycardia present.  Pulmonary/Chest: Effort normal and breath sounds normal. No stridor. No respiratory distress.  Abdominal: She exhibits no distension.  Musculoskeletal: She exhibits no edema.  Neurological: She is alert and oriented to person, place, and time. No cranial nerve deficit.  Skin: Skin is warm and dry.     Psychiatric: She has a normal mood and affect.  Nursing note and vitals reviewed.    ED Treatments / Results  Labs (all labs ordered are listed, but only abnormal results are displayed) Labs Reviewed - No data to display  EKG None  Radiology No results found.  Procedures Procedures (including critical care time)  Medications Ordered in ED Medications  sodium chloride 0.9 % bolus 1,000 mL (1,000 mLs Intravenous New Bag/Given 06/25/18 1952)    methylPREDNISolone sodium succinate (SOLU-MEDROL) 125 mg/2 mL injection 125 mg (125 mg Intravenous Given 06/25/18 1948)  diphenhydrAMINE (BENADRYL) injection 25 mg (25 mg Intravenous Given 06/25/18 1945)  famotidine (PEPCID) IVPB 20 mg premix (0 mg Intravenous Stopped 06/25/18 2053)     Initial Impression / Assessment and Plan / ED Course  I have reviewed the triage vital signs and the nursing notes.  Pertinent labs & imaging results that were available during my care of the patient were reviewed by me and considered in my medical decision making (see chart for details).     9:34 PM Patient awake, alert, ambulatory. She has received steroids, Benadryl, Pepcid, fluids, facial flushing has improved substantially, she is ambulatory,  without complaints per With a lengthy conversation about today's episode, concern for allergic reaction. With improvement, no evidence for respiratory compromise, and reassuring findings, the patient was discharged in stable condition with her female companion to follow-up with primary care.  Final Clinical Impressions(s) / ED Diagnoses  Allergic reaction, initial encounter   Gerhard Munch, MD 06/25/18 2135

## 2018-06-25 NOTE — ED Triage Notes (Signed)
Pt c/o shortness of breath after getting a steroid shot and taking bactrim at urgent care about 2 hours PTA. LS clear at this time, redness noted to the pt's chest and chin. Pt reports that she took zyrtec PTA as well.

## 2018-06-25 NOTE — Discharge Instructions (Signed)
As discussed, your evaluation today has been largely reassuring.  But, it is important that you monitor your condition carefully, and do not hesitate to return to the ED if you develop new, or concerning changes in your condition. ? ?Otherwise, please follow-up with your physician for appropriate ongoing care. ? ?

## 2022-06-15 ENCOUNTER — Other Ambulatory Visit: Payer: Self-pay | Admitting: Family Medicine

## 2022-06-15 DIAGNOSIS — G43109 Migraine with aura, not intractable, without status migrainosus: Secondary | ICD-10-CM

## 2022-07-03 ENCOUNTER — Ambulatory Visit
Admission: RE | Admit: 2022-07-03 | Discharge: 2022-07-03 | Disposition: A | Discharge status: Home / Self Care | Payer: BLUE CROSS/BLUE SHIELD | Source: Ambulatory Visit | Attending: Family Medicine | Admitting: Family Medicine

## 2022-07-03 DIAGNOSIS — G43109 Migraine with aura, not intractable, without status migrainosus: Secondary | ICD-10-CM

## 2024-02-24 ENCOUNTER — Ambulatory Visit: Payer: Medicare Other | Admitting: Podiatry

## 2024-02-24 ENCOUNTER — Ambulatory Visit (INDEPENDENT_AMBULATORY_CARE_PROVIDER_SITE_OTHER)

## 2024-02-24 DIAGNOSIS — M7989 Other specified soft tissue disorders: Secondary | ICD-10-CM

## 2024-02-24 NOTE — Progress Notes (Signed)
 Subjective:  Patient ID: Patricia Howell, female    DOB: [birth year],  MRN: [id number]  Chief Complaint  Patient presents with   Foot Pain    Place on right foot     68 y.o. female presents with the above complaint.  Patient presents with right ankle soft tissue mass.  Patient states been there for quite some time has gotten bigger recently wanted to get it evaluated.  It causes her some discomfort but overall it has not been too painful.  She is a diabetic.  She denies any other acute complaints.  Mild pain with ambulation   Review of Systems: Negative except as noted in the HPI. Denies N/V/F/Ch.  Past Medical History:  Diagnosis Date   Anxiety    Asthma    no problems as adult - no inhaler   Chronic lower back pain    otc meds prn   Diabetes mellitus without complication (HCC)    border line - diet controlled   Hyperlipidemia    Hypothyroidism    Rosacea    Seasonal allergies    SVD (spontaneous vaginal delivery)    x 2    Current Outpatient Medications:    Calcium Citrate-Vitamin D (CALCIUM CITRATE + D3 PO), Take 1 tablet by mouth every morning., Disp: , Rfl:    cetirizine (ZYRTEC) 10 MG tablet, Take 10 mg by mouth daily., Disp: , Rfl:    Collagen Hydrolysate, Bovine, POWD, Take 1 Scoop by mouth daily., Disp: , Rfl:    diphenhydrAMINE (BENADRYL) 25 MG tablet, Take 1 tablet (25 mg total) by mouth 3 (three) times daily. Take one tablet three times daily for two days, Disp: 10 tablet, Rfl: 0   famotidine (PEPCID) 20 MG tablet, Take 1 tablet (20 mg total) by mouth 2 (two) times daily. Take one tablet twice daily for two days, Disp: 10 tablet, Rfl: 0   ibuprofen (ADVIL,MOTRIN) 200 MG tablet, Take 400 mg by mouth every 6 (six) hours as needed for pain., Disp: , Rfl:    levothyroxine (SYNTHROID, LEVOTHROID) 88 MCG tablet, Take 88 mcg by mouth daily., Disp: , Rfl: 1   loratadine (CLARITIN) 10 MG tablet, Take 10 mg by mouth at bedtime as needed for allergies., Disp: , Rfl:     losartan (COZAAR) 100 MG tablet, Take 100 mg by mouth daily., Disp: , Rfl: 1   metFORMIN (GLUMETZA) 1000 MG (MOD) 24 hr tablet, Take 1,000 mg by mouth daily before supper., Disp: , Rfl:    oxyCODONE-acetaminophen (ROXICET) 5-325 MG per tablet, Take 1-2 tablets by mouth every 4 (four) hours as needed for pain. (Patient not taking: Reported on 06/25/2018), Disp: 30 tablet, Rfl: 0   predniSONE (DELTASONE) 20 MG tablet, Take 2 tablets (40 mg total) by mouth daily with breakfast. For the next four days, Disp: 8 tablet, Rfl: 0   rosuvastatin (CRESTOR) 20 MG tablet, Take 20 mg by mouth daily. , Disp: , Rfl:    venlafaxine (EFFEXOR) 75 MG tablet, Take 187.5 mg by mouth at bedtime. , Disp: , Rfl:   Social History   Tobacco Use  Smoking Status Former   Current packs/day: 0.00   Average packs/day: 1 pack/day for 22.0 years (22.0 ttl pk-yrs)   Types: Cigarettes   Start date: 12/20/1984   Quit date: 12/20/2006   Years since quitting: 17.2  Smokeless Tobacco Never    Allergies  Allergen Reactions   Vicodin [Hydrocodone-Acetaminophen] Other (See Comments)    Patient's states "it makes her depressed -  end of the workl".  Patient does not want this medication.  Patient states percocet ok.   Amoxicillin Rash    Has patient had a PCN reaction causing immediate rash, facial/tongue/throat swelling, SOB or lightheadedness with hypotension: Yes Has patient had a PCN reaction causing severe rash involving mucus membranes or skin necrosis: No Has patient had a PCN reaction that required hospitalization: No Has patient had a PCN reaction occurring within the last 10 years: No If all of the above answers are "NO", then may proceed with Cephalosporin use.    Objective:  There were no vitals filed for this visit. There is no height or weight on file to calculate BMI. Constitutional Well developed. Well nourished.  Vascular Dorsalis pedis pulses palpable bilaterally. Posterior tibial pulses palpable  bilaterally. Capillary refill normal to all digits.  No cyanosis or clubbing noted. Pedal hair growth normal.  Neurologic Normal speech. Oriented to person, place, and time. Epicritic sensation to light touch grossly present bilaterally.  Dermatologic Nails well groomed and normal in appearance. No open wounds. No skin lesions.  Orthopedic: Soft tissue mass clinically appreciated right ankle does not appear to be fluctuant.  Appears to be more indurated.  May likely be coming from a tendon.  Some fluid clinically appreciated.  No concern for abscess or infection   Radiographs: 3 views of theTrue mature adult right ankle: No abnormalities noted.  No bony involvement noted.  Increase in soft tissue volume and density noted near the ankle consistent with soft tissue mass.  Ankle joint within normal limits posterior heel spurring noted mild midfoot arthritis noted Assessment:   1. Soft tissue mass    Plan:  Patient was evaluated and treated and all questions answered.  Right ankle soft tissue mass -All questions and concerns were discussed with the patient extensive detail given the presence of soft tissue mass and some pain associate with the patient would benefit from an MRI evaluation to assess the depth and severity of the mass.  I discussed with the patient that she will need a surgical excision of the soft tissue mass however we will plan on getting an MRI patient agrees with the plan would like to proceed with an MRI -MRI was ordered  No follow-ups on file.

## 2024-03-16 ENCOUNTER — Ambulatory Visit
Admission: RE | Admit: 2024-03-16 | Discharge: 2024-03-16 | Disposition: A | Discharge status: Home / Self Care | Source: Ambulatory Visit | Attending: Podiatry | Admitting: Podiatry

## 2024-03-16 DIAGNOSIS — M7989 Other specified soft tissue disorders: Secondary | ICD-10-CM

## 2024-03-16 MED ORDER — GADOPICLENOL 0.5 MMOL/ML IV SOLN
9.0000 mL | Freq: Once | INTRAVENOUS | Status: AC | PRN
  Administered 2024-03-16: 9 mL via INTRAVENOUS

## 2024-04-09 ENCOUNTER — Telehealth: Payer: Self-pay | Admitting: Podiatry

## 2024-04-09 NOTE — Telephone Encounter (Signed)
 Patient is requesting to speak with Dr. Lydia Sams regarding MRI results. Please contact patient [phone]

## 2024-04-27 ENCOUNTER — Ambulatory Visit: Admitting: Podiatry

## 2024-04-27 ENCOUNTER — Ambulatory Visit (INDEPENDENT_AMBULATORY_CARE_PROVIDER_SITE_OTHER)

## 2024-04-27 ENCOUNTER — Other Ambulatory Visit: Payer: Self-pay | Admitting: Podiatry

## 2024-04-27 DIAGNOSIS — M21611 Bunion of right foot: Secondary | ICD-10-CM | POA: Diagnosis not present

## 2024-04-27 DIAGNOSIS — M2041 Other hammer toe(s) (acquired), right foot: Secondary | ICD-10-CM

## 2024-04-27 DIAGNOSIS — Z01818 Encounter for other preprocedural examination: Secondary | ICD-10-CM

## 2024-04-27 DIAGNOSIS — M21619 Bunion of unspecified foot: Secondary | ICD-10-CM

## 2024-04-27 DIAGNOSIS — M7989 Other specified soft tissue disorders: Secondary | ICD-10-CM

## 2024-04-27 DIAGNOSIS — M24574 Contracture, right foot: Secondary | ICD-10-CM | POA: Diagnosis not present

## 2024-04-27 NOTE — Progress Notes (Unsigned)
 Subjective:  Patient ID: Patricia Howell, female    DOB: 10-Apr-1956,  MRN: [id number]  Chief Complaint  Patient presents with   Soft tissue mass    68 y.o. female presents with the above complaint.  Patient presents for follow-up of right ankle soft tissue mass.  She is here to have it removed.  She states that it still continues to bother her she has secondary complaint of right bunion and hammertoe contractures well painful to touch is progressive gotten worse worse with ambulation hurts with pressure she would like to discuss treatment options for this.  Pain scale is 7 out of 10 dull aching nature.  She has failed all conservative care including shoe gear modification padding protecting offloading.  She wishes to discuss surgical options at this time   Review of Systems: Negative except as noted in the HPI. Denies N/V/F/Ch.  Past Medical History:  Diagnosis Date   Anxiety    Asthma    no problems as adult - no inhaler   Chronic lower back pain    otc meds prn   Diabetes mellitus without complication (HCC)    border line - diet controlled   Hyperlipidemia    Hypothyroidism    Rosacea    Seasonal allergies    SVD (spontaneous vaginal delivery)    x 2    Current Outpatient Medications:    Calcium Citrate-Vitamin D (CALCIUM CITRATE + D3 PO), Take 1 tablet by mouth every morning., Disp: , Rfl:    cetirizine (ZYRTEC) 10 MG tablet, Take 10 mg by mouth daily., Disp: , Rfl:    Collagen Hydrolysate, Bovine, POWD, Take 1 Scoop by mouth daily., Disp: , Rfl:    diphenhydrAMINE  (BENADRYL ) 25 MG tablet, Take 1 tablet (25 mg total) by mouth 3 (three) times daily. Take one tablet three times daily for two days, Disp: 10 tablet, Rfl: 0   famotidine  (PEPCID ) 20 MG tablet, Take 1 tablet (20 mg total) by mouth 2 (two) times daily. Take one tablet twice daily for two days, Disp: 10 tablet, Rfl: 0   ibuprofen (ADVIL,MOTRIN) 200 MG tablet, Take 400 mg by mouth every 6 (six) hours as needed for  pain., Disp: , Rfl:    levothyroxine (SYNTHROID, LEVOTHROID) 88 MCG tablet, Take 88 mcg by mouth daily., Disp: , Rfl: 1   loratadine (CLARITIN) 10 MG tablet, Take 10 mg by mouth at bedtime as needed for allergies., Disp: , Rfl:    losartan (COZAAR) 100 MG tablet, Take 100 mg by mouth daily., Disp: , Rfl: 1   metFORMIN (GLUMETZA) 1000 MG (MOD) 24 hr tablet, Take 1,000 mg by mouth daily before supper., Disp: , Rfl:    oxyCODONE -acetaminophen  (ROXICET) 5-325 MG per tablet, Take 1-2 tablets by mouth every 4 (four) hours as needed for pain. (Patient not taking: Reported on 06/25/2018), Disp: 30 tablet, Rfl: 0   predniSONE  (DELTASONE ) 20 MG tablet, Take 2 tablets (40 mg total) by mouth daily with breakfast. For the next four days, Disp: 8 tablet, Rfl: 0   rosuvastatin (CRESTOR) 20 MG tablet, Take 20 mg by mouth daily. , Disp: , Rfl:    venlafaxine (EFFEXOR) 75 MG tablet, Take 187.5 mg by mouth at bedtime. , Disp: , Rfl:   Social History   Tobacco Use  Smoking Status Former   Current packs/day: 0.00   Average packs/day: 1 pack/day for 22.0 years (22.0 ttl pk-yrs)   Types: Cigarettes   Start date: 12/20/1984   Quit date: 12/20/2006  Years since quitting: 17.3  Smokeless Tobacco Never    Allergies  Allergen Reactions   Vicodin [Hydrocodone-Acetaminophen ] Other (See Comments)    Patient's states "it makes her depressed - end of the workl".  Patient does not want this medication.  Patient states percocet ok.   Amoxicillin Rash    Has patient had a PCN reaction causing immediate rash, facial/tongue/throat swelling, SOB or lightheadedness with hypotension: Yes Has patient had a PCN reaction causing severe rash involving mucus membranes or skin necrosis: No Has patient had a PCN reaction that required hospitalization: No Has patient had a PCN reaction occurring within the last 10 years: No If all of the above answers are "NO", then may proceed with Cephalosporin use.    Objective:  There were no  vitals filed for this visit. There is no height or weight on file to calculate BMI. Constitutional Well developed. Well nourished.  Vascular Dorsalis pedis pulses palpable bilaterally. Posterior tibial pulses palpable bilaterally. Capillary refill normal to all digits.  No cyanosis or clubbing noted. Pedal hair growth normal.  Neurologic Normal speech. Oriented to person, place, and time. Epicritic sensation to light touch grossly present bilaterally.  Dermatologic Nails well groomed and normal in appearance. No open wounds. No skin lesions.  Orthopedic: Soft tissue mass clinically appreciated right ankle does not appear to be fluctuant.  Appears to be more indurated.  May likely be coming from a tendon.  Some fluid clinically appreciated.  No concern for abscess or infection  Right moderate bunion deformity no intra-articular first MPJ pain noted.  This is a track bound not a tracking deformity.  Hammer semiflexible second digit hammertoe contracture noted with some underlying metatarsophalangeal joint contracture.   Radiographs: 3 views of theTrue mature adult right ankle: No abnormalities noted.  No bony involvement noted.  Increase in soft tissue volume and density noted near the ankle consistent with soft tissue mass.  Ankle joint within normal limits posterior heel spurring noted mild midfoot arthritis noted  Moderate bunion deformity noted with increase in intermetatarsal angle there is increase in hallux valgus angle.  Sesamoid position 5 out of 7.  No intra-articular first MPJ arthritis noted Assessment:   1. Bunion   2. Hammertoe of second toe of right foot   3. Joint contracture of foot, right   4. Soft tissue mass   5. Encounter for preoperative examination for general surgical procedure    Plan:  Patient was evaluated and treated and all questions answered.  Right ankle soft tissue mass -All questions and concerns were discussed with the patient extensive detail MRI was  reviewed with the patient which shows concerns for ganglion cyst multiple cyst along the course of the ankle.  At this time patient will benefit from surgical excision of the cyst I discussed my preoperative intra postop plan with the patient in extensive detail she states understand like to proceed with surgery  Right moderate bunion deformity with underlying second digit hammertoe contracture -All questions and concerns were discussed with the patient in extensive detail given the amount of hammertoe contracture that is present patient will benefit from chevron osteotomy with phalangeal osteotomy with second digit arthroplasty with possible capsulotomy of the second MPJ.  I discussed this with the patient in extensive detail she states understand like to proceed with surgery I discussed my preoperative intra postop operative plan with the patient.  She will also have fixation with -Informed surgical risk consent was reviewed and read aloud to the patient.  I reviewed the films.  I have discussed my findings with the patient in great detail.  I have discussed all risks including but not limited to infection, stiffness, scarring, limp, disability, deformity, damage to blood vessels and nerves, numbness, poor healing, need for braces, arthritis, chronic pain, amputation, death.  All benefits and realistic expectations discussed in great detail.  I have made no promises as to the outcome.  I have provided realistic expectations.  I have offered the patient a 2nd opinion, which they have declined and assured me they preferred to proceed despite the risks

## 2024-06-18 ENCOUNTER — Other Ambulatory Visit: Payer: Self-pay | Admitting: Podiatry

## 2024-06-18 DIAGNOSIS — M21611 Bunion of right foot: Secondary | ICD-10-CM | POA: Diagnosis not present

## 2024-06-18 DIAGNOSIS — M7989 Other specified soft tissue disorders: Secondary | ICD-10-CM | POA: Diagnosis not present

## 2024-06-18 DIAGNOSIS — M2041 Other hammer toe(s) (acquired), right foot: Secondary | ICD-10-CM | POA: Diagnosis not present

## 2024-06-18 MED ORDER — OXYCODONE-ACETAMINOPHEN 5-325 MG PO TABS: 1.0000 | ORAL_TABLET | ORAL | 0 refills | Status: AC | PRN

## 2024-06-18 MED ORDER — IBUPROFEN 800 MG PO TABS: 800.0000 mg | ORAL_TABLET | Freq: Four times a day (QID) | ORAL | 1 refills | Status: AC | PRN

## 2024-06-27 ENCOUNTER — Ambulatory Visit (INDEPENDENT_AMBULATORY_CARE_PROVIDER_SITE_OTHER): Admitting: Podiatry

## 2024-06-27 ENCOUNTER — Other Ambulatory Visit: Payer: Self-pay | Admitting: Podiatry

## 2024-06-27 ENCOUNTER — Ambulatory Visit (INDEPENDENT_AMBULATORY_CARE_PROVIDER_SITE_OTHER)

## 2024-06-27 DIAGNOSIS — M2041 Other hammer toe(s) (acquired), right foot: Secondary | ICD-10-CM | POA: Diagnosis not present

## 2024-06-27 DIAGNOSIS — M7989 Other specified soft tissue disorders: Secondary | ICD-10-CM

## 2024-06-27 DIAGNOSIS — M21619 Bunion of unspecified foot: Secondary | ICD-10-CM

## 2024-06-27 NOTE — Progress Notes (Signed)
 Subjective:  Patient ID: Patricia Howell, female    DOB: 07/09/1956,  MRN: [id number]  Chief Complaint  Patient presents with   Routine Post Op    POV # 1 DOS 06/18/24 RT EXCISION SOFT TISSUE MASS, RT BUNIONECTOMY/OSTEOTOMY CHEVRON W PHALANEAL POSTERIOR W/2ND DIGIT HAMMERTOE W/ CAPSULOTOMY OF 2ND MPJ    DOS: 06/18/2024 Procedure: Right excision of ganglion cyst with right bunionectomy with phalangeal osteotomy with second digit hammertoe correction  68 y.o. female returns for post-op check.  Patient states she is doing well bandages clean dry and intact pain is controlled.  Review of Systems: Negative except as noted in the HPI. Denies N/V/F/Ch.  Past Medical History:  Diagnosis Date   Anxiety    Asthma    no problems as adult - no inhaler   Chronic lower back pain    otc meds prn   Diabetes mellitus without complication (HCC)    border line - diet controlled   Hyperlipidemia    Hypothyroidism    Rosacea    Seasonal allergies    SVD (spontaneous vaginal delivery)    x 2    Current Outpatient Medications:    Calcium Citrate-Vitamin D (CALCIUM CITRATE + D3 PO), Take 1 tablet by mouth every morning., Disp: , Rfl:    cetirizine (ZYRTEC) 10 MG tablet, Take 10 mg by mouth daily., Disp: , Rfl:    Collagen Hydrolysate, Bovine, POWD, Take 1 Scoop by mouth daily., Disp: , Rfl:    diphenhydrAMINE  (BENADRYL ) 25 MG tablet, Take 1 tablet (25 mg total) by mouth 3 (three) times daily. Take one tablet three times daily for two days, Disp: 10 tablet, Rfl: 0   famotidine  (PEPCID ) 20 MG tablet, Take 1 tablet (20 mg total) by mouth 2 (two) times daily. Take one tablet twice daily for two days, Disp: 10 tablet, Rfl: 0   ibuprofen  (ADVIL ) 800 MG tablet, Take 1 tablet (800 mg total) by mouth every 6 (six) hours as needed., Disp: 60 tablet, Rfl: 1   ibuprofen  (ADVIL ,MOTRIN ) 200 MG tablet, Take 400 mg by mouth every 6 (six) hours as needed for pain., Disp: , Rfl:    levothyroxine (SYNTHROID,  LEVOTHROID) 88 MCG tablet, Take 88 mcg by mouth daily., Disp: , Rfl: 1   loratadine (CLARITIN) 10 MG tablet, Take 10 mg by mouth at bedtime as needed for allergies., Disp: , Rfl:    losartan (COZAAR) 100 MG tablet, Take 100 mg by mouth daily., Disp: , Rfl: 1   metFORMIN (GLUMETZA) 1000 MG (MOD) 24 hr tablet, Take 1,000 mg by mouth daily before supper., Disp: , Rfl:    oxyCODONE -acetaminophen  (PERCOCET) 5-325 MG tablet, Take 1 tablet by mouth every 4 (four) hours as needed for severe pain (pain score 7-10)., Disp: 30 tablet, Rfl: 0   oxyCODONE -acetaminophen  (ROXICET) 5-325 MG per tablet, Take 1-2 tablets by mouth every 4 (four) hours as needed for pain. (Patient not taking: Reported on 06/25/2018), Disp: 30 tablet, Rfl: 0   predniSONE  (DELTASONE ) 20 MG tablet, Take 2 tablets (40 mg total) by mouth daily with breakfast. For the next four days, Disp: 8 tablet, Rfl: 0   rosuvastatin (CRESTOR) 20 MG tablet, Take 20 mg by mouth daily. , Disp: , Rfl:    venlafaxine (EFFEXOR) 75 MG tablet, Take 187.5 mg by mouth at bedtime. , Disp: , Rfl:   Social History   Tobacco Use  Smoking Status Former   Current packs/day: 0.00   Average packs/day: 1 pack/day for 22.0 years (22.0  ttl pk-yrs)   Types: Cigarettes   Start date: 12/20/1984   Quit date: 12/20/2006   Years since quitting: 17.5  Smokeless Tobacco Never    Allergies  Allergen Reactions   Vicodin [Hydrocodone-Acetaminophen ] Other (See Comments)    Patient's states it makes her depressed - end of the workl.  Patient does not want this medication.  Patient states percocet ok.   Amoxicillin Rash    Has patient had a PCN reaction causing immediate rash, facial/tongue/throat swelling, SOB or lightheadedness with hypotension: Yes Has patient had a PCN reaction causing severe rash involving mucus membranes or skin necrosis: No Has patient had a PCN reaction that required hospitalization: No Has patient had a PCN reaction occurring within the last 10 years:  No If all of the above answers are NO, then may proceed with Cephalosporin use.    Objective:  There were no vitals filed for this visit. There is no height or weight on file to calculate BMI. Constitutional Well developed. Well nourished.  Vascular Foot warm and well perfused. Capillary refill normal to all digits.   Neurologic Normal speech. Oriented to person, place, and time. Epicritic sensation to light touch grossly present bilaterally.  Dermatologic Skin healing well without signs of infection. Skin edges well coapted without signs of infection.  Orthopedic: Tenderness to palpation noted about the surgical site.   Radiographs: 3 views of skeletally mature the right foot:Good correction alignment noted reduction of bunion deformity noted.  Reduction of hammertoe contracture noted.  Hardware is intact no signs of loosening or backing out noted. Assessment:   1. Bunion   2. Hammertoe of second toe of right foot   3. Soft tissue mass    Plan:  Patient was evaluated and treated and all questions answered.  S/p foot surgery left -Progressing as expected post-operatively. -XR: See above -WB Status: Weightbearing as tolerated to the heel in cam boot -Sutures: Intact.  No clinical signs of dehiscence noted no complication noted. -Medications: None -Foot redressed.  No follow-ups on file.

## 2024-07-11 ENCOUNTER — Ambulatory Visit (INDEPENDENT_AMBULATORY_CARE_PROVIDER_SITE_OTHER): Admitting: Podiatry

## 2024-07-11 DIAGNOSIS — M2041 Other hammer toe(s) (acquired), right foot: Secondary | ICD-10-CM

## 2024-07-11 DIAGNOSIS — M21619 Bunion of unspecified foot: Secondary | ICD-10-CM

## 2024-07-11 DIAGNOSIS — M7989 Other specified soft tissue disorders: Secondary | ICD-10-CM

## 2024-07-11 DIAGNOSIS — Z9889 Other specified postprocedural states: Secondary | ICD-10-CM

## 2024-07-11 NOTE — Progress Notes (Signed)
 Subjective:  Patient ID: Patricia Howell, female    DOB: 1956-06-02,  MRN: [id number]  Chief Complaint  Patient presents with   Routine Post Op    POV # 2 DOS 06/18/24 RT EXCISION SOFT TISSUE MASS, RT BUNIONECTOMY/OSTEOTOMY CHEVRON W PHALANEAL POSTERIOR W/2ND DIGIT HAMMERTOE W/ CAPSULOTOMY OF 2ND MPJ    DOS: 06/18/2024 Procedure: Right excision of ganglion cyst with right bunionectomy with phalangeal osteotomy with second digit hammertoe correction  68 y.o. female returns for post-op check.  Patient states she is doing well bandages clean dry and intact pain is controlled.  Review of Systems: Negative except as noted in the HPI. Denies N/V/F/Ch.  Past Medical History:  Diagnosis Date   Anxiety    Asthma    no problems as adult - no inhaler   Chronic lower back pain    otc meds prn   Diabetes mellitus without complication (HCC)    border line - diet controlled   Hyperlipidemia    Hypothyroidism    Rosacea    Seasonal allergies    SVD (spontaneous vaginal delivery)    x 2    Current Outpatient Medications:    Calcium Citrate-Vitamin D (CALCIUM CITRATE + D3 PO), Take 1 tablet by mouth every morning., Disp: , Rfl:    cetirizine (ZYRTEC) 10 MG tablet, Take 10 mg by mouth daily., Disp: , Rfl:    Collagen Hydrolysate, Bovine, POWD, Take 1 Scoop by mouth daily., Disp: , Rfl:    diphenhydrAMINE  (BENADRYL ) 25 MG tablet, Take 1 tablet (25 mg total) by mouth 3 (three) times daily. Take one tablet three times daily for two days, Disp: 10 tablet, Rfl: 0   famotidine  (PEPCID ) 20 MG tablet, Take 1 tablet (20 mg total) by mouth 2 (two) times daily. Take one tablet twice daily for two days, Disp: 10 tablet, Rfl: 0   ibuprofen  (ADVIL ) 800 MG tablet, Take 1 tablet (800 mg total) by mouth every 6 (six) hours as needed., Disp: 60 tablet, Rfl: 1   ibuprofen  (ADVIL ,MOTRIN ) 200 MG tablet, Take 400 mg by mouth every 6 (six) hours as needed for pain., Disp: , Rfl:    levothyroxine (SYNTHROID,  LEVOTHROID) 88 MCG tablet, Take 88 mcg by mouth daily., Disp: , Rfl: 1   loratadine (CLARITIN) 10 MG tablet, Take 10 mg by mouth at bedtime as needed for allergies., Disp: , Rfl:    losartan (COZAAR) 100 MG tablet, Take 100 mg by mouth daily., Disp: , Rfl: 1   metFORMIN (GLUMETZA) 1000 MG (MOD) 24 hr tablet, Take 1,000 mg by mouth daily before supper., Disp: , Rfl:    oxyCODONE -acetaminophen  (PERCOCET) 5-325 MG tablet, Take 1 tablet by mouth every 4 (four) hours as needed for severe pain (pain score 7-10)., Disp: 30 tablet, Rfl: 0   oxyCODONE -acetaminophen  (ROXICET) 5-325 MG per tablet, Take 1-2 tablets by mouth every 4 (four) hours as needed for pain. (Patient not taking: Reported on 06/25/2018), Disp: 30 tablet, Rfl: 0   predniSONE  (DELTASONE ) 20 MG tablet, Take 2 tablets (40 mg total) by mouth daily with breakfast. For the next four days, Disp: 8 tablet, Rfl: 0   rosuvastatin (CRESTOR) 20 MG tablet, Take 20 mg by mouth daily. , Disp: , Rfl:    venlafaxine (EFFEXOR) 75 MG tablet, Take 187.5 mg by mouth at bedtime. , Disp: , Rfl:   Social History   Tobacco Use  Smoking Status Former   Current packs/day: 0.00   Average packs/day: 1 pack/day for 22.0 years (22.0  ttl pk-yrs)   Types: Cigarettes   Start date: 12/20/1984   Quit date: 12/20/2006   Years since quitting: 17.5  Smokeless Tobacco Never    Allergies  Allergen Reactions   Vicodin [Hydrocodone-Acetaminophen ] Other (See Comments)    Patient's states it makes her depressed - end of the workl.  Patient does not want this medication.  Patient states percocet ok.   Amoxicillin Rash    Has patient had a PCN reaction causing immediate rash, facial/tongue/throat swelling, SOB or lightheadedness with hypotension: Yes Has patient had a PCN reaction causing severe rash involving mucus membranes or skin necrosis: No Has patient had a PCN reaction that required hospitalization: No Has patient had a PCN reaction occurring within the last 10 years:  No If all of the above answers are NO, then may proceed with Cephalosporin use.    Objective:  There were no vitals filed for this visit. There is no height or weight on file to calculate BMI. Constitutional Well developed. Well nourished.  Vascular Foot warm and well perfused. Capillary refill normal to all digits.   Neurologic Normal speech. Oriented to person, place, and time. Epicritic sensation to light touch grossly present bilaterally.  Dermatologic Skin completely epithelialized no signs of dehiscence noted no complication noted reduction of deformity noted.  No signs of recurrence of ganglion cyst noted  Orthopedic: Tenderness to palpation noted about the surgical site.   Radiographs: 3 views of skeletally mature the right foot:Good correction alignment noted reduction of bunion deformity noted.  Reduction of hammertoe contracture noted.  Hardware is intact no signs of loosening or backing out noted. Assessment:   1. Bunion   2. Hammertoe of second toe of right foot   3. Soft tissue mass   4. Status post foot surgery     Plan:  Patient was evaluated and treated and all questions answered.  S/p foot surgery left -Progressing as expected post-operatively. -XR: See above -WB Status: Weightbearing as tolerated to the heel in cam boot -Sutures: Removed no clinical signs of dehiscence noted no complication noted. -Medications: None - I will plan on removing the pin in 3 weeks.  No follow-ups on file.

## 2024-08-03 ENCOUNTER — Ambulatory Visit (INDEPENDENT_AMBULATORY_CARE_PROVIDER_SITE_OTHER): Admitting: Podiatry

## 2024-08-03 ENCOUNTER — Ambulatory Visit (INDEPENDENT_AMBULATORY_CARE_PROVIDER_SITE_OTHER)

## 2024-08-03 DIAGNOSIS — M2041 Other hammer toe(s) (acquired), right foot: Secondary | ICD-10-CM | POA: Diagnosis not present

## 2024-08-03 DIAGNOSIS — M21619 Bunion of unspecified foot: Secondary | ICD-10-CM

## 2024-08-03 DIAGNOSIS — Z9889 Other specified postprocedural states: Secondary | ICD-10-CM

## 2024-08-03 NOTE — Progress Notes (Signed)
 Subjective:  Patient ID: Patricia Howell, female    DOB: 14-Dec-1956,  MRN: [id number]  No chief complaint on file.   DOS: 06/18/2024 Procedure: Right excision of ganglion cyst with right bunionectomy with phalangeal osteotomy with second digit hammertoe correction  68 y.o. female returns for post-op check.  Patient states she is doing well bandages clean dry and intact pain is controlled.  Review of Systems: Negative except as noted in the HPI. Denies N/V/F/Ch.  Past Medical History:  Diagnosis Date   Anxiety    Asthma    no problems as adult - no inhaler   Chronic lower back pain    otc meds prn   Diabetes mellitus without complication (HCC)    border line - diet controlled   Hyperlipidemia    Hypothyroidism    Rosacea    Seasonal allergies    SVD (spontaneous vaginal delivery)    x 2    Current Outpatient Medications:    Calcium Citrate-Vitamin D (CALCIUM CITRATE + D3 PO), Take 1 tablet by mouth every morning., Disp: , Rfl:    cetirizine (ZYRTEC) 10 MG tablet, Take 10 mg by mouth daily., Disp: , Rfl:    Collagen Hydrolysate, Bovine, POWD, Take 1 Scoop by mouth daily., Disp: , Rfl:    diphenhydrAMINE  (BENADRYL ) 25 MG tablet, Take 1 tablet (25 mg total) by mouth 3 (three) times daily. Take one tablet three times daily for two days, Disp: 10 tablet, Rfl: 0   famotidine  (PEPCID ) 20 MG tablet, Take 1 tablet (20 mg total) by mouth 2 (two) times daily. Take one tablet twice daily for two days, Disp: 10 tablet, Rfl: 0   ibuprofen  (ADVIL ) 800 MG tablet, Take 1 tablet (800 mg total) by mouth every 6 (six) hours as needed., Disp: 60 tablet, Rfl: 1   ibuprofen  (ADVIL ,MOTRIN ) 200 MG tablet, Take 400 mg by mouth every 6 (six) hours as needed for pain., Disp: , Rfl:    levothyroxine (SYNTHROID, LEVOTHROID) 88 MCG tablet, Take 88 mcg by mouth daily., Disp: , Rfl: 1   loratadine (CLARITIN) 10 MG tablet, Take 10 mg by mouth at bedtime as needed for allergies., Disp: , Rfl:    losartan  (COZAAR) 100 MG tablet, Take 100 mg by mouth daily., Disp: , Rfl: 1   metFORMIN (GLUMETZA) 1000 MG (MOD) 24 hr tablet, Take 1,000 mg by mouth daily before supper., Disp: , Rfl:    oxyCODONE -acetaminophen  (PERCOCET) 5-325 MG tablet, Take 1 tablet by mouth every 4 (four) hours as needed for severe pain (pain score 7-10)., Disp: 30 tablet, Rfl: 0   oxyCODONE -acetaminophen  (ROXICET) 5-325 MG per tablet, Take 1-2 tablets by mouth every 4 (four) hours as needed for pain. (Patient not taking: Reported on 06/25/2018), Disp: 30 tablet, Rfl: 0   predniSONE  (DELTASONE ) 20 MG tablet, Take 2 tablets (40 mg total) by mouth daily with breakfast. For the next four days, Disp: 8 tablet, Rfl: 0   rosuvastatin (CRESTOR) 20 MG tablet, Take 20 mg by mouth daily. , Disp: , Rfl:    venlafaxine (EFFEXOR) 75 MG tablet, Take 187.5 mg by mouth at bedtime. , Disp: , Rfl:   Social History   Tobacco Use  Smoking Status Former   Current packs/day: 0.00   Average packs/day: 1 pack/day for 22.0 years (22.0 ttl pk-yrs)   Types: Cigarettes   Start date: 12/20/1984   Quit date: 12/20/2006   Years since quitting: 17.6  Smokeless Tobacco Never    Allergies  Allergen Reactions  Vicodin [Hydrocodone-Acetaminophen ] Other (See Comments)    Patient's states it makes her depressed - end of the workl.  Patient does not want this medication.  Patient states percocet ok.   Amoxicillin Rash    Has patient had a PCN reaction causing immediate rash, facial/tongue/throat swelling, SOB or lightheadedness with hypotension: Yes Has patient had a PCN reaction causing severe rash involving mucus membranes or skin necrosis: No Has patient had a PCN reaction that required hospitalization: No Has patient had a PCN reaction occurring within the last 10 years: No If all of the above answers are NO, then may proceed with Cephalosporin use.    Objective:  There were no vitals filed for this visit. There is no height or weight on file to  calculate BMI. Constitutional Well developed. Well nourished.  Vascular Foot warm and well perfused. Capillary refill normal to all digits.   Neurologic Normal speech. Oriented to person, place, and time. Epicritic sensation to light touch grossly present bilaterally.  Dermatologic Skin completely epithelialized no signs of dehiscence noted no complication noted reduction of deformity noted.  No signs of recurrence of ganglion cyst noted  Orthopedic: Tenderness to palpation noted about the surgical site.   Radiographs: 3 views of skeletally mature the right foot:Good correction alignment noted reduction of bunion deformity noted.  Reduction of hammertoe contracture noted.  Hardware is intact no signs of loosening or backing out noted. Assessment:   1. Hammertoe of second toe of right foot   2. Bunion   3. Status post foot surgery     Plan:  Patient was evaluated and treated and all questions answered.  S/p foot surgery left - Clinically healed officially discharge from my care-foot and ankle issues on future she will come back and see me.  At this time she can return to regular activities no restrictions.  Good range of motion noted of the first metatarsophalangeal joint noted no recurrence of cyst noted  No follow-ups on file.

## 2024-09-12 ENCOUNTER — Ambulatory Visit (HOSPITAL_COMMUNITY)
Admission: EM | Admit: 2024-09-12 | Discharge: 2024-09-12 | Disposition: A | Discharge status: Home / Self Care | Attending: Family Medicine | Admitting: Family Medicine

## 2024-09-12 ENCOUNTER — Encounter (HOSPITAL_COMMUNITY): Payer: Self-pay | Admitting: Emergency Medicine

## 2024-09-12 DIAGNOSIS — R21 Rash and other nonspecific skin eruption: Secondary | ICD-10-CM

## 2024-09-12 DIAGNOSIS — J069 Acute upper respiratory infection, unspecified: Secondary | ICD-10-CM

## 2024-09-12 MED ORDER — METRONIDAZOLE 0.75 % EX CREA: TOPICAL_CREAM | CUTANEOUS | 0 refills | Status: AC

## 2024-09-12 NOTE — Medical Student Note (Signed)
 [facility] Insurance account manager Note For educational purposes for Medical, PA and NP students only and not part of the legal medical record.   CSN: [id number] Arrival date & time: 09/12/24  0802      History   Chief Complaint Chief Complaint  Patient presents with   Skin Discoloration   Nasal Congestion   Cough    HPI Patricia Howell is a 68 y.o. female.  Pt has a hx of hypothyroidism, DM2, HLD. She returned from a cruise of the [place] on Sunday. Monday she started having facial flushing, congested cough, nasal congestion, sore throat, and body aches. She denies and fever, chills, CP, or ShOB. The flushing of the face is not pruritic, but is tender. The cough is worse when she lays down at night, but it has not been disrupting her sleep. She did take a home COVID test yesterday, but it was negative. A fellow traveling companion has similar symptoms.   The history is provided by the patient.  Cough Associated symptoms: myalgias, rash (face) and sore throat   Associated symptoms: no chest pain, no chills, no fever, no headaches and no shortness of breath     Past Medical History:  Diagnosis Date   Anxiety    Asthma    no problems as adult - no inhaler   Chronic lower back pain    otc meds prn   Diabetes mellitus without complication (HCC)    border line - diet controlled   Hyperlipidemia    Hypothyroidism    Rosacea    Seasonal allergies    SVD (spontaneous vaginal delivery)    x 2    There are no active problems to display for this patient.   Past Surgical History:  Procedure Laterality Date   COLONOSCOPY  2012   DILITATION & CURRETTAGE/HYSTROSCOPY WITH VERSAPOINT RESECTION N/A 05/24/2013   Procedure: DILATATION & CURETTAGE/HYSTEROSCOPY WITH VERSAPOINT RESECTION;  Surgeon: Charlie JINNY Flowers, MD;  Location: [facility];  Service: Gynecology;  Laterality: N/A;   LAPAROTOMY     removal of cyst   urethea stretched     as a child    OB History   No  obstetric history on file.      Home Medications    Prior to Admission medications   Medication Sig Start Date End Date Taking? Authorizing Provider  Calcium Citrate-Vitamin D (CALCIUM CITRATE + D3 PO) Take 1 tablet by mouth every morning.    [provider]  cetirizine (ZYRTEC) 10 MG tablet Take 10 mg by mouth daily.    [provider]  Collagen Hydrolysate, Bovine, POWD Take 1 Scoop by mouth daily.    [provider]  diphenhydrAMINE  (BENADRYL ) 25 MG tablet Take 1 tablet (25 mg total) by mouth 3 (three) times daily. Take one tablet three times daily for two days 06/25/18   Garrick Charleston, MD  famotidine  (PEPCID ) 20 MG tablet Take 1 tablet (20 mg total) by mouth 2 (two) times daily. Take one tablet twice daily for two days 06/25/18   Garrick Charleston, MD  ibuprofen  (ADVIL ) 800 MG tablet Take 1 tablet (800 mg total) by mouth every 6 (six) hours as needed. 06/18/24   Tobie Franky SQUIBB, DPM  ibuprofen  (ADVIL ,MOTRIN ) 200 MG tablet Take 400 mg by mouth every 6 (six) hours as needed for pain.    [provider]  levothyroxine (SYNTHROID, LEVOTHROID) 88 MCG tablet Take 88 mcg by mouth daily. 06/17/18   [provider]  loratadine (CLARITIN) 10 MG tablet Take 10 mg by mouth at bedtime as needed for allergies.    [provider]  losartan (COZAAR) 100 MG tablet Take 100 mg by mouth daily. 04/20/18   [provider]  metFORMIN (GLUMETZA) 1000 MG (MOD) 24 hr tablet Take 1,000 mg by mouth daily before supper.    [provider]  oxyCODONE -acetaminophen  (PERCOCET) 5-325 MG tablet Take 1 tablet by mouth every 4 (four) hours as needed for severe pain (pain score 7-10). 06/18/24   Tobie Franky SQUIBB, DPM  oxyCODONE -acetaminophen  (ROXICET) 5-325 MG per tablet Take 1-2 tablets by mouth every 4 (four) hours as needed for pain. Patient not taking: Reported on 06/25/2018 05/24/13   Gorge Ade, MD  predniSONE  (DELTASONE ) 20 MG tablet Take 2 tablets (40 mg  total) by mouth daily with breakfast. For the next four days 06/25/18   Garrick Charleston, MD  rosuvastatin (CRESTOR) 20 MG tablet Take 20 mg by mouth daily.     [provider]  venlafaxine (EFFEXOR) 75 MG tablet Take 187.5 mg by mouth at bedtime.     [provider]    Family History Family History  Problem Relation Age of Onset   Arthritis Other    Cancer Other    Hyperlipidemia Other    Hypertension Other    Stroke Other    Diabetes Other     Social History Social History   Tobacco Use   Smoking status: Former    Current packs/day: 0.00    Average packs/day: 1 pack/day for 22.0 years (22.0 ttl pk-yrs)    Types: Cigarettes    Start date: 12/20/1984    Quit date: 12/20/2006    Years since quitting: 17.7   Smokeless tobacco: Never  Substance Use Topics   Alcohol use: Yes    Comment: weekends - wine/beer   Drug use: No     Allergies   Sulfamethoxazole-trimethoprim, Vicodin [hydrocodone-acetaminophen ], and Amoxicillin   Review of Systems Review of Systems  Constitutional:  Negative for chills and fever.  HENT:  Positive for congestion, postnasal drip, sneezing, sore throat and voice change.   Respiratory:  Positive for cough. Negative for chest tightness and shortness of breath.   Cardiovascular:  Negative for chest pain.  Gastrointestinal:  Negative for abdominal pain, diarrhea, nausea and vomiting.  Musculoskeletal:  Positive for myalgias.  Skin:  Positive for rash (face).  Neurological:  Negative for headaches.  All other systems reviewed and are negative.    Physical Exam Updated Vital Signs BP 121/81 (BP Location: Right Arm)   Pulse (!) 102   Temp 98.5 F (36.9 C) (Oral)   Resp 18   SpO2 95%   Physical Exam Constitutional:      Appearance: Normal appearance. She is ill-appearing.  HENT:     Right Ear: Tympanic membrane and external ear normal.     Left Ear: Tympanic membrane and external ear normal.     Ears:     Comments: Erythema  to bilateral canal with tenderness on exam of the R ear.     Nose: Rhinorrhea present.     Mouth/Throat:     Pharynx: Posterior oropharyngeal erythema present.  Eyes:     Conjunctiva/sclera: Conjunctivae normal.     Pupils: Pupils are equal, round, and reactive to light.  Cardiovascular:     Rate and Rhythm: Regular rhythm. Tachycardia present.     Pulses: Normal pulses.     Heart sounds: Normal heart sounds.  Pulmonary:  Effort: Pulmonary effort is normal.     Breath sounds: Normal breath sounds.  Abdominal:     General: Bowel sounds are normal.     Palpations: Abdomen is soft.  Musculoskeletal:     Cervical back: Neck supple.  Lymphadenopathy:     Cervical: Cervical adenopathy present.  Skin:    Findings: Erythema (diffuse facial erythema with tenderness) present.  Neurological:     Mental Status: She is alert.      ED Treatments / Results  Labs (all labs ordered are listed, but only abnormal results are displayed) Labs Reviewed - No data to display  EKG  Radiology No results found.  Procedures Procedures (including critical care time)  Medications Ordered in ED Medications - No data to display   Initial Impression / Assessment and Plan / ED Course  I have reviewed the triage vital signs and the nursing notes.  Pertinent labs & imaging results that were available during my care of the patient were reviewed by me and considered in my medical decision making (see chart for details).     Symptoms consistent with viral URI. Discussed potential retesting and treatment for COVID if positive, but pt declined testing and treatment at this time. Discussed OTC treatment for symptomatic relief with guaifenesin and fluticasone nasal spray. She is encouraged to continue APAP and ibuprofen  for on going myalgias and fevers.   Uncertain the exact etiology of the facial erythema. May be caused by the viral URI. Given prescription for metronidazole  0.75% cream to apply BID  if it does not improve in the next couple of days. Also discussed follow up with dermatology if erythema does not improve. Red flag symptoms reviewed and return precautions given.    Final Clinical Impressions(s) / ED Diagnoses   Final diagnoses:  None    New Prescriptions New Prescriptions   No medications on file

## 2024-09-12 NOTE — ED Provider Notes (Signed)
 [facility]   [id number] 09/12/24 Arrival Time: 0802  ASSESSMENT & PLAN:  1. Viral upper respiratory tract infection   2. Rash and nonspecific skin eruption    Patricia Howell is a 68 y.o. female.   Pt has a hx of hypothyroidism, DM2, HLD. She returned from a cruise of the [place] on Sunday. Monday she started having facial flushing, congested cough, nasal congestion, sore throat, and body aches. She denies and fever, chills, CP, or ShOB. The flushing of the face is not pruritic, but is tender. The cough is worse when she lays down at night, but it has not been disrupting her sleep. She did take a home COVID test yesterday, but it was negative. A fellow traveling companion has similar symptoms.   The history is provided by the patient.  Cough Associated symptoms: myalgias, rash (face) and sore throat   Associated symptoms: no chest pain, no chills, no fever, no headaches and no shortness of breath          Past Medical History:  Diagnosis Date   Anxiety     Asthma      no problems as adult - no inhaler   Chronic lower back pain      otc meds prn   Diabetes mellitus without complication (HCC)      border line - diet controlled   Hyperlipidemia     Hypothyroidism     Rosacea     Seasonal allergies     SVD (spontaneous vaginal delivery)      x 2          There are no active problems to display for this patient.          Past Surgical History:  Procedure Laterality Date   COLONOSCOPY   2012   DILITATION & CURRETTAGE/HYSTROSCOPY WITH VERSAPOINT RESECTION N/A 05/24/2013    Procedure: DILATATION & CURETTAGE/HYSTEROSCOPY WITH VERSAPOINT RESECTION;  Surgeon: Charlie JINNY Flowers, MD;  Location: [facility];  Service: Gynecology;  Laterality: N/A;   LAPAROTOMY        removal of cyst   urethea stretched        as a child          OB History   No obstetric history on file.          Home Medications                       Prior to Admission medications    Medication Sig Start Date End Date Taking? Authorizing Provider  Calcium Citrate-Vitamin D (CALCIUM CITRATE + D3 PO) Take 1 tablet by mouth every morning.       [provider]  cetirizine (ZYRTEC) 10 MG tablet Take 10 mg by mouth daily.       [provider]  Collagen Hydrolysate, Bovine, POWD Take 1 Scoop by mouth daily.       [provider]  diphenhydrAMINE  (BENADRYL ) 25 MG tablet Take 1 tablet (25 mg total) by mouth 3 (three) times daily. Take one tablet three times daily for two days 06/25/18     Garrick Charleston, MD  famotidine  (PEPCID ) 20 MG tablet Take 1 tablet (20 mg total) by mouth 2 (two) times daily. Take one tablet twice daily for two days 06/25/18     Garrick Charleston, MD  ibuprofen  (ADVIL ) 800 MG tablet Take 1 tablet (800 mg total) by mouth every 6 (six) hours as needed. 06/18/24  Tobie Franky SQUIBB, DPM  ibuprofen  (ADVIL ,MOTRIN ) 200 MG tablet Take 400 mg by mouth every 6 (six) hours as needed for pain.       [provider]  levothyroxine (SYNTHROID, LEVOTHROID) 88 MCG tablet Take 88 mcg by mouth daily. 06/17/18     [provider]  loratadine (CLARITIN) 10 MG tablet Take 10 mg by mouth at bedtime as needed for allergies.       [provider]  losartan (COZAAR) 100 MG tablet Take 100 mg by mouth daily. 04/20/18     [provider]  metFORMIN (GLUMETZA) 1000 MG (MOD) 24 hr tablet Take 1,000 mg by mouth daily before supper.       [provider]  oxyCODONE -acetaminophen  (PERCOCET) 5-325 MG tablet Take 1 tablet by mouth every 4 (four) hours as needed for severe pain (pain score 7-10). 06/18/24     Tobie Franky SQUIBB, DPM  oxyCODONE -acetaminophen  (ROXICET) 5-325 MG per tablet Take 1-2 tablets by mouth every 4 (four) hours as needed for pain. Patient not taking: Reported on 06/25/2018 05/24/13     Gorge Ade, MD  predniSONE  (DELTASONE ) 20 MG tablet Take 2 tablets (40 mg total) by mouth daily with breakfast. For the next four  days 06/25/18     Garrick Charleston, MD  rosuvastatin (CRESTOR) 20 MG tablet Take 20 mg by mouth daily.        [provider]  venlafaxine (EFFEXOR) 75 MG tablet Take 187.5 mg by mouth at bedtime.        [provider]      Family History      Family History  Problem Relation Age of Onset   Arthritis Other     Cancer Other     Hyperlipidemia Other     Hypertension Other     Stroke Other     Diabetes Other            Social History Social History  Social History         Tobacco Use   Smoking status: Former      Current packs/day: 0.00      Average packs/day: 1 pack/day for 22.0 years (22.0 ttl pk-yrs)      Types: Cigarettes      Start date: 12/20/1984      Quit date: 12/20/2006      Years since quitting: 17.7   Smokeless tobacco: Never  Substance Use Topics   Alcohol use: Yes      Comment: weekends - wine/beer   Drug use: No          Allergies              Sulfamethoxazole-trimethoprim, Vicodin [hydrocodone-acetaminophen ], and Amoxicillin     Review of Systems Review of Systems  Constitutional:  Negative for chills and fever.  HENT:  Positive for congestion, postnasal drip, sneezing, sore throat and voice change.   Respiratory:  Positive for cough. Negative for chest tightness and shortness of breath.   Cardiovascular:  Negative for chest pain.  Gastrointestinal:  Negative for abdominal pain, diarrhea, nausea and vomiting.  Musculoskeletal:  Positive for myalgias.  Skin:  Positive for rash (face).  Neurological:  Negative for headaches.  All other systems reviewed and are negative.      Physical Exam Updated Vital Signs BP 121/81 (BP Location: Right Arm)   Pulse (!) 102   Temp 98.5 F (36.9 C) (Oral)   Resp 18   SpO2 95%    Physical Exam  Constitutional:      Appearance: Normal appearance. She is ill-appearing.  HENT:     Right Ear: Tympanic membrane and external ear normal.     Left Ear: Tympanic membrane and external ear normal.      Ears:     Comments: Erythema to bilateral canal with tenderness on exam of the R ear.     Nose: Rhinorrhea present.     Mouth/Throat:     Pharynx: Posterior oropharyngeal erythema present.  Eyes:     Conjunctiva/sclera: Conjunctivae normal.     Pupils: Pupils are equal, round, and reactive to light.  Cardiovascular:     Rate and Rhythm: Regular rhythm. Tachycardia present.     Pulses: Normal pulses.     Heart sounds: Normal heart sounds.  Pulmonary:     Effort: Pulmonary effort is normal.     Breath sounds: Normal breath sounds.  Abdominal:     General: Bowel sounds are normal.     Palpations: Abdomen is soft.  Musculoskeletal:     Cervical back: Neck supple.  Lymphadenopathy:     Cervical: Cervical adenopathy present.  Skin:    Findings: Erythema (diffuse facial erythema with tenderness) present.  Neurological:     Mental Status: She is alert.         ED Treatments / Results  Labs (all labs ordered are listed, but only abnormal results are displayed) Labs Reviewed - No data to display   EKG   Radiology Imaging Results (Last 48 hours)  No results found.     Procedures Procedures (including critical care time)   Medications Ordered in ED Medications - No data to display     Initial Impression / Assessment and Plan / ED Course  I have reviewed the triage vital signs and the nursing notes.   Pertinent labs & imaging results that were available during my care of the patient were reviewed by me and considered in my medical decision making (see chart for details).     Symptoms consistent with viral URI. Discussed potential retesting and treatment for COVID if positive, but pt declined testing and treatment at this time. Discussed OTC treatment for symptomatic relief with guaifenesin and fluticasone nasal spray. She is encouraged to continue APAP and ibuprofen  for on going myalgias and fevers.    Uncertain the exact etiology of the facial erythema. May be caused by  the viral URI. Given prescription for metronidazole  0.75% cream to apply BID if it does not improve in the next couple of days. Also discussed follow up with dermatology if erythema does not improve. Red flag symptoms reviewed and return precautions given.    1. Viral upper respiratory tract infection   2. Rash and nonspecific skin eruption    Meds ordered this encounter  Medications   metroNIDAZOLE  (METROCREAM ) 0.75 % cream    Sig: Apply a thin film topically and rub into affected area twice daily for up to 4 weeks.    Dispense:  45 g    Refill:  0   May return as needed. S/S of erysipelas discussed. To watch closely.  I was personally present and re-performed the exam and medical decision making and verified the service and findings are accurately documented in the student's note.  Redell Fort, MD 09/12/2024 10:19 AM       [place], MD 09/12/24 1019

## 2024-09-12 NOTE — ED Triage Notes (Signed)
 Pt reports that she was on a cruise last week and got back home on Sunday. Monday woke up with her face red and irritated. Denies any new products. Reports Tuesday started having cough, congestion, body aches. Took ibuprofen  for aches.
